# Patient Record
Sex: Male | Born: 1965
Health system: Southern US, Community
[De-identification: ages and names within clinical notes are randomized; demographics above are authoritative.]

## PROBLEM LIST (undated history)

## (undated) DIAGNOSIS — Z789 Other specified health status: Secondary | ICD-10-CM

## (undated) DIAGNOSIS — K219 Gastro-esophageal reflux disease without esophagitis: Secondary | ICD-10-CM

## (undated) HISTORY — PX: COLONOSCOPY: SHX174

---

## 2003-08-08 ENCOUNTER — Encounter: Admission: RE | Admit: 2003-08-08 | Discharge: 2003-08-08 | Payer: Self-pay

## 2004-02-25 ENCOUNTER — Encounter: Admission: RE | Admit: 2004-02-25 | Discharge: 2004-02-25 | Payer: Self-pay | Admitting: General Surgery

## 2008-07-10 ENCOUNTER — Ambulatory Visit (HOSPITAL_COMMUNITY): Admission: RE | Admit: 2008-07-10 | Discharge: 2008-07-10 | Payer: Self-pay | Admitting: Family Medicine

## 2009-09-17 ENCOUNTER — Ambulatory Visit: Payer: Self-pay | Admitting: Family Medicine

## 2011-07-01 ENCOUNTER — Emergency Department (HOSPITAL_COMMUNITY)
Admission: EM | Admit: 2011-07-01 | Discharge: 2011-07-01 | Disposition: A | Discharge status: Home / Self Care | Payer: BC Managed Care – PPO | Attending: Emergency Medicine | Admitting: Emergency Medicine

## 2011-07-01 DIAGNOSIS — S41109A Unspecified open wound of unspecified upper arm, initial encounter: Secondary | ICD-10-CM | POA: Insufficient documentation

## 2011-07-01 DIAGNOSIS — Y990 Civilian activity done for income or pay: Secondary | ICD-10-CM | POA: Insufficient documentation

## 2011-07-01 DIAGNOSIS — W268XXA Contact with other sharp object(s), not elsewhere classified, initial encounter: Secondary | ICD-10-CM | POA: Insufficient documentation

## 2011-07-10 ENCOUNTER — Emergency Department (HOSPITAL_COMMUNITY)
Admission: EM | Admit: 2011-07-10 | Discharge: 2011-07-10 | Disposition: A | Discharge status: Home / Self Care | Payer: BC Managed Care – PPO | Attending: Emergency Medicine | Admitting: Emergency Medicine

## 2011-07-10 DIAGNOSIS — Z4802 Encounter for removal of sutures: Secondary | ICD-10-CM | POA: Insufficient documentation

## 2015-04-07 ENCOUNTER — Emergency Department: Admit: 2015-04-07 | Disposition: A | Discharge status: Home / Self Care | Payer: Self-pay | Admitting: Internal Medicine

## 2015-04-07 LAB — CBC WITH DIFFERENTIAL/PLATELET
Basophil #: 0 10*3/uL (ref 0.0–0.1)
Basophil %: 0.4 %
EOS PCT: 0.2 %
Eosinophil #: 0 10*3/uL (ref 0.0–0.7)
HCT: 46.2 % (ref 40.0–52.0)
HGB: 14.9 g/dL (ref 13.0–18.0)
LYMPHS ABS: 1.6 10*3/uL (ref 1.0–3.6)
Lymphocyte %: 27.9 %
MCH: 28.4 pg (ref 26.0–34.0)
MCHC: 32.3 g/dL (ref 32.0–36.0)
MCV: 88 fL (ref 80–100)
MONO ABS: 1 x10 3/mm (ref 0.2–1.0)
MONOS PCT: 16.5 %
NEUTROS ABS: 3.2 10*3/uL (ref 1.4–6.5)
Neutrophil %: 55 %
PLATELETS: 218 10*3/uL (ref 150–440)
RBC: 5.26 10*6/uL (ref 4.40–5.90)
RDW: 13.2 % (ref 11.5–14.5)
WBC: 5.9 10*3/uL (ref 3.8–10.6)

## 2015-04-07 LAB — COMPREHENSIVE METABOLIC PANEL
ALT: 18 U/L
AST: 22 U/L
Albumin: 4.1 g/dL
Alkaline Phosphatase: 56 U/L
Anion Gap: 8 (ref 7–16)
BUN: 14 mg/dL
Bilirubin,Total: 0.3 mg/dL
CHLORIDE: 98 mmol/L — AB
CO2: 31 mmol/L
Calcium, Total: 9.1 mg/dL
Creatinine: 1.32 mg/dL — ABNORMAL HIGH
EGFR (African American): >60
EGFR (Non-African Amer.): >60
GLUCOSE: 118 mg/dL — AB
Potassium: 4.7 mmol/L
SODIUM: 137 mmol/L
Total Protein: 7.6 g/dL

## 2016-09-05 ENCOUNTER — Emergency Department (HOSPITAL_COMMUNITY)
Admission: EM | Admit: 2016-09-05 | Discharge: 2016-09-05 | Disposition: A | Discharge status: Home / Self Care | Payer: Worker's Compensation | Attending: Emergency Medicine | Admitting: Emergency Medicine

## 2016-09-05 ENCOUNTER — Emergency Department (HOSPITAL_COMMUNITY): Payer: Worker's Compensation

## 2016-09-05 ENCOUNTER — Encounter (HOSPITAL_COMMUNITY): Payer: Self-pay

## 2016-09-05 DIAGNOSIS — W228XXA Striking against or struck by other objects, initial encounter: Secondary | ICD-10-CM | POA: Insufficient documentation

## 2016-09-05 DIAGNOSIS — Z23 Encounter for immunization: Secondary | ICD-10-CM | POA: Insufficient documentation

## 2016-09-05 DIAGNOSIS — S060X0A Concussion without loss of consciousness, initial encounter: Secondary | ICD-10-CM

## 2016-09-05 DIAGNOSIS — S0191XA Laceration without foreign body of unspecified part of head, initial encounter: Secondary | ICD-10-CM

## 2016-09-05 DIAGNOSIS — Y939 Activity, unspecified: Secondary | ICD-10-CM | POA: Insufficient documentation

## 2016-09-05 DIAGNOSIS — Y929 Unspecified place or not applicable: Secondary | ICD-10-CM | POA: Insufficient documentation

## 2016-09-05 DIAGNOSIS — Y990 Civilian activity done for income or pay: Secondary | ICD-10-CM | POA: Diagnosis not present

## 2016-09-05 MED ORDER — TETANUS-DIPHTH-ACELL PERTUSSIS 5-2.5-18.5 LF-MCG/0.5 IM SUSP
0.5000 mL | Freq: Once | INTRAMUSCULAR | Status: AC
  Administered 2016-09-05: 0.5 mL via INTRAMUSCULAR
  Filled 2016-09-05: qty 0.5

## 2016-09-05 MED ORDER — ACETAMINOPHEN 325 MG PO TABS
650.0000 mg | ORAL_TABLET | Freq: Once | ORAL | Status: AC
  Administered 2016-09-05: 650 mg via ORAL
  Filled 2016-09-05: qty 2

## 2016-09-05 NOTE — ED Notes (Signed)
Pt departed in NAD, refused use of wheelchair.  

## 2016-09-05 NOTE — ED Triage Notes (Signed)
Pt reports he hit his head on a piece of steel at work. Pt has small 1 inch laceration to the top of his head. He reports he had near syncope at the time of the incidence. No reports headache and nausea.

## 2016-09-05 NOTE — ED Provider Notes (Signed)
[facility] Provider Note   CSN: [id number] Arrival date & time: 09/05/16  1221     History   Chief Complaint Chief Complaint  Patient presents with  . Head Injury    HPI Marcy PanningDanny Grabill is a 50 y.o. male with no significant past medical history who presents to the ED today to be evaluated after a head injury. 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 He states he stood up too quickly and struck the top of his head on a steel bar causing a laceration to the top of his head. Patient states that he nearly "blacked out" and fell to the ground afterwards. Patient has had intermittent blurry vision, dizziness and nausea since then. Patient reports increased dizziness upon standing and feeling off balance. She does not anticoagulated. Unknown last tetanus shot. He denies any paresthesias or neck pain.  HPI  History reviewed. No pertinent past medical history.  There are no active problems to display for this patient.   History reviewed. No pertinent surgical history.     Home Medications    Prior to Admission medications   Not on File    Family History History reviewed. No pertinent family history.  Social History Social History  Substance Use Topics  . Smoking status: Never Smoker  . Smokeless tobacco: Never Used  . Alcohol use Yes     Comment: occasional      Allergies   Diphenhydramine   Review of Systems Review of Systems  All other systems reviewed and are negative.    Physical Exam Updated Vital Signs BP 134/95   Pulse 62   Temp 97.8 F (36.6 C) (Oral)   Resp 20   SpO2 98%   Physical Exam  Constitutional: He is oriented to person, place, and time. He appears well-developed and well-nourished. No distress.  HENT:  Head: Normocephalic.  Mouth/Throat: No oropharyngeal exudate.  No battle sign. No raccoon eyes. No hemotympanum.  1 cm laceration to top of scalp. No foreign body seen or palpated. No  sign of skull depression.  Eyes: Conjunctivae and EOM are normal. Pupils are equal, round, and reactive to light. Right eye exhibits no discharge. Left eye exhibits no discharge. No scleral icterus.  Neck: Normal range of motion. Neck supple.  Cardiovascular: Normal rate, regular rhythm, normal heart sounds and intact distal pulses.  Exam reveals no gallop and no friction rub.   No murmur heard. Pulmonary/Chest: Effort normal and breath sounds normal. No respiratory distress. He has no wheezes. He has no rales. He exhibits no tenderness.  Abdominal: Soft. He exhibits no distension. There is no tenderness. There is no guarding.  Musculoskeletal: Normal range of motion. He exhibits no edema.  Neurological: He is alert and oriented to person, place, and time.  Strength 5/5 throughout. No sensory deficits. No gait abnormality. No dysmetria. No slurred speech. No facial droop. Negative pronator drift.    Skin: Skin is warm and dry. No rash noted. He is not diaphoretic. No erythema. No pallor.  Psychiatric: He has a normal mood and affect. His behavior is normal.  Nursing note and vitals reviewed.    ED Treatments / Results  Labs (all labs ordered are listed, but only abnormal results are displayed) Labs Reviewed  URINE RAPID DRUG SCREEN, HOSP PERFORMED    EKG  EKG Interpretation None       Radiology No results found.  Procedures Procedures (including critical care time)  LACERATION  REPAIR Performed by: Dub Mikes Authorized by: Dub Mikes Consent: Verbal consent obtained. Risks and benefits: risks, benefits and alternatives were discussed Consent given by: patient Patient identity confirmed: provided demographic data Prepped and Draped in normal sterile fashion Wound explored  Laceration Location: scalp  Laceration Length: 1 cm  No Foreign Bodies seen or palpated  Anesthesia: local infiltration  Local anesthetic: None  Irrigation method:  syringe Amount of cleaning: standard  Skin closure: Approximated   Technique: One staple   Patient tolerance: Patient tolerated the procedure well with no immediate complications.   Medications Ordered in ED Medications  Tdap (BOOSTRIX) injection 0.5 mL (not administered)     Initial Impression / Assessment and Plan / ED Course  I have reviewed the triage vital signs and the nursing notes.  Pertinent labs & imaging results that were available during my care of the patient were reviewed by me and considered in my medical decision making (see chart for details).  Clinical Course    Otherwise healthy 50 year old male presents to the ED today to be evaluated after head injury. Patient struck his head on a steel bar around 10:30 this morning and subsequently felt near-syncope, dizziness, nausea. No neurological deficits noted on exam. Laceration repair to the ED with one staple. Patient tolerated the procedure well. Patient signed out to Fayrene Helper PA-C at shift change pending CT head. If negative patient will be discharged home with PCP follow-up. Patient also notes that he needs to have the staple removed in 1 week. Return precautions discussed with patient and family members.  Final Clinical Impressions(s) / ED Diagnoses   Final diagnoses:  Concussion, without loss of consciousness, initial encounter  Laceration of head, initial encounter    New Prescriptions New Prescriptions   No medications on file     Dub Mikes, PA-C 09/05/16 2013    Raeford Razor, MD 09/05/16 [phone]

## 2016-09-05 NOTE — ED Notes (Signed)
Pt has dried blood on top of head

## 2016-09-05 NOTE — Discharge Instructions (Signed)
Follow up at either an urgent care or this department in 1 week for staple removal. Return to the ED sooner if you experience loss of consciousness, blurred vision, weakness, vomiting.

## 2016-09-11 ENCOUNTER — Emergency Department (HOSPITAL_COMMUNITY)
Admission: EM | Admit: 2016-09-11 | Discharge: 2016-09-11 | Disposition: A | Discharge status: Home / Self Care | Payer: Worker's Compensation | Attending: Emergency Medicine | Admitting: Emergency Medicine

## 2016-09-11 ENCOUNTER — Encounter (HOSPITAL_COMMUNITY): Payer: Self-pay | Admitting: Emergency Medicine

## 2016-09-11 DIAGNOSIS — Z4802 Encounter for removal of sutures: Secondary | ICD-10-CM | POA: Diagnosis not present

## 2016-09-11 NOTE — ED Triage Notes (Signed)
Here for staple removal-- has 1 staple in scalp.

## 2016-09-11 NOTE — Discharge Instructions (Signed)
Please read and follow all provided instructions.  Your diagnoses today include:  1. Removal of staple    Tests performed today include: Vital signs. See below for your results today.   Medications prescribed:  Take as prescribed   Home care instructions:  Follow any educational materials contained in this packet.  Follow-up instructions: Please follow-up with your primary care provider for further evaluation of symptoms and treatment   Return instructions:  Please return to the Emergency Department if you do not get better, if you get worse, or new symptoms OR  - Fever (temperature greater than 101.27F)  - Bleeding that does not stop with holding pressure to the area    -Severe pain (please note that you may be more sore the day after your accident)  - Chest Pain  - Difficulty breathing  - Severe nausea or vomiting  - Inability to tolerate food and liquids  - Passing out  - Skin becoming red around your wounds  - Change in mental status (confusion or lethargy)  - New numbness or weakness    Please return if you have any other emergent concerns.  Additional Information:  Your vital signs today were: BP 146/100    Pulse 78    Temp 98.1 F (36.7 C) (Oral)    Resp 16    SpO2 100%  If your blood pressure (BP) was elevated above 135/85 this visit, please have this repeated by your doctor within one month. ---------------

## 2016-09-11 NOTE — ED Provider Notes (Signed)
[facility] Provider Note   CSN: [id number] Arrival date & time: 09/11/16  1011  By signing my name below, I, Phillis HaggisGabriella Gaje, attest that this documentation has been prepared under the direction and in the presence of Audry Piliyler Bobi Daudelin, PA-C. Electronically Signed: Phillis HaggisGabriella Gaje, ED Scribe. 09/11/16. 10:15 AM.   History   Chief Complaint Chief Complaint  Patient presents with  . Suture / Staple Removal   The history is provided by the patient. No language interpreter was used.   HPI Comments: Bruce Diaz is a 50 y.o. male who presents to the Emergency Department requesting a staple removal. Pt was seen on 09/05/16 s/p head injury where he struck his head against a steel bar. One staple was placed with no complications. Pt denies fever, chills, nausea, vomiting, or color change. No drainage from site. No other symptoms noted.    No past medical history on file.  There are no active problems to display for this patient.   No past surgical history on file.   Home Medications    Prior to Admission medications   Not on File    Family History No family history on file.  Social History Social History  Substance Use Topics  . Smoking status: Never Smoker  . Smokeless tobacco: Never Used  . Alcohol use Yes     Comment: occasional      Allergies   Diphenhydramine   Review of Systems Review of Systems  Constitutional: Negative for chills and fever.  Gastrointestinal: Negative for nausea and vomiting.  Skin: Positive for wound. Negative for color change.   Physical Exam Updated Vital Signs BP 146/100   Pulse 78   Temp 98.1 F (36.7 C) (Oral)   Resp 16   SpO2 100%   Physical Exam  Constitutional: He is oriented to person, place, and time. He appears well-developed and well-nourished.  HENT:  Head: Normocephalic and atraumatic.  Eyes: EOM are normal.  Neck: Normal range of motion. Neck supple.  Cardiovascular: Normal rate.   Pulmonary/Chest: Effort normal.    Musculoskeletal: Normal range of motion.  1 cm laceration to the top of scalp, well healed, no signs of infection, or erythema.  Neurological: He is alert and oriented to person, place, and time.  Skin: Skin is warm and dry.  Psychiatric: He has a normal mood and affect. His behavior is normal.  Nursing note and vitals reviewed.  ED Treatments / Results  DIAGNOSTIC STUDIES: Oxygen Saturation is 100% on RA, normal by my interpretation.    COORDINATION OF CARE: 10:18 AM-Discussed treatment plan which includes staple removal with pt at bedside and pt agreed to plan.    Labs (all labs ordered are listed, but only abnormal results are displayed) Labs Reviewed - No data to display  EKG  EKG Interpretation None       Radiology No results found.  Procedures .Suture Removal Date/Time: 09/11/2016 10:19 AM Performed by: Audry PiliMOHR, Val Farnam Authorized by: Audry PiliMOHR, Dangelo Guzzetta   Consent:    Consent obtained:  Verbal   Consent given by:  Patient Location:    Location:  Head/neck   Head/neck location:  Scalp Procedure details:    Wound appearance:  No signs of infection   Number of staples removed:  1 Post-procedure details:    Post-removal:  No dressing applied   Patient tolerance of procedure:  Tolerated well, no immediate complications   (including critical care time)  Medications Ordered in ED Medications - No data to display   Initial Impression /  Assessment and Plan / ED Course  I have reviewed the triage vital signs and the nursing notes.  Pertinent labs & imaging results that were available during my care of the patient were reviewed by me and considered in my medical decision making (see chart for details).  Clinical Course   Final Clinical Impressions(s) / ED Diagnoses   I have reviewed the relevant previous healthcare records. I obtained HPI from historian.  ED Course:  Assessment:  Staple removal   Pt to ER for staple/suture removal and wound check as above.  Procedure tolerated well. Vitals normal, no signs of infection. Scar minimization & return precautions given at dc.   Disposition/Plan:  DC Home Additional Verbal discharge instructions given and discussed with patient.  Pt Instructed to f/u with PCP in the next week for evaluation and treatment of symptoms. Return precautions given Pt acknowledges and agrees with plan  Supervising Physician Jacalyn Lefevre, MD   Final diagnoses:  Removal of staple   I personally performed the services described in this documentation, which was scribed in my presence. The recorded information has been reviewed and is accurate.   New Prescriptions New Prescriptions   No medications on file     Audry Pili, PA-C 09/11/16 1021    Jacalyn Lefevre, MD 09/11/16 1242

## 2016-09-11 NOTE — ED Notes (Signed)
Declined W/C at D/C and was escorted to lobby by RN. 

## 2017-05-26 ENCOUNTER — Emergency Department (HOSPITAL_BASED_OUTPATIENT_CLINIC_OR_DEPARTMENT_OTHER): Payer: Worker's Compensation

## 2017-05-26 ENCOUNTER — Encounter (HOSPITAL_BASED_OUTPATIENT_CLINIC_OR_DEPARTMENT_OTHER): Payer: Self-pay | Admitting: Emergency Medicine

## 2017-05-26 ENCOUNTER — Emergency Department (HOSPITAL_BASED_OUTPATIENT_CLINIC_OR_DEPARTMENT_OTHER)
Admission: EM | Admit: 2017-05-26 | Discharge: 2017-05-26 | Disposition: A | Discharge status: Home / Self Care | Payer: Worker's Compensation | Attending: Emergency Medicine | Admitting: Emergency Medicine

## 2017-05-26 DIAGNOSIS — S93402A Sprain of unspecified ligament of left ankle, initial encounter: Secondary | ICD-10-CM | POA: Diagnosis not present

## 2017-05-26 DIAGNOSIS — Y990 Civilian activity done for income or pay: Secondary | ICD-10-CM | POA: Insufficient documentation

## 2017-05-26 DIAGNOSIS — Y939 Activity, unspecified: Secondary | ICD-10-CM | POA: Insufficient documentation

## 2017-05-26 DIAGNOSIS — Y9259 Other trade areas as the place of occurrence of the external cause: Secondary | ICD-10-CM | POA: Insufficient documentation

## 2017-05-26 DIAGNOSIS — S99922A Unspecified injury of left foot, initial encounter: Secondary | ICD-10-CM | POA: Diagnosis present

## 2017-05-26 DIAGNOSIS — T1490XA Injury, unspecified, initial encounter: Secondary | ICD-10-CM

## 2017-05-26 DIAGNOSIS — W230XXA Caught, crushed, jammed, or pinched between moving objects, initial encounter: Secondary | ICD-10-CM | POA: Insufficient documentation

## 2017-05-26 MED ORDER — IBUPROFEN 600 MG PO TABS: 600.0000 mg | ORAL_TABLET | Freq: Four times a day (QID) | ORAL | 0 refills | Status: DC | PRN

## 2017-05-26 NOTE — Discharge Instructions (Signed)
I recommend wearing your ankle brace throughout the day while you are on her feet. Continue to rest, elevate and apply ice to your ankle for 15-20 minutes 3-4 times daily. Follow-up with the podiatry clinic listed below her symptoms have not improved over the next week. Please return to the Emergency Department if symptoms worsen or new onset of fever, redness, swelling, warmth, numbness, weakness, decreased range of motion.

## 2017-05-26 NOTE — ED Triage Notes (Signed)
Patient states that a piece of steel fell onto his foot and ankle 2 weeks ago. The patient states that it is not healing and he is concerned

## 2017-05-26 NOTE — ED Provider Notes (Signed)
[facility]   CSN: [id number] Arrival date & time: 05/26/17  1631   By signing my name below, I, Soijett Blue, attest that this documentation has been prepared under the direction and in the presence of Melburn HakeNicole Nadeau, PA-C Electronically Signed: Soijett Blue, ED Scribe. 05/26/17. 6:21 PM.  History   Chief Complaint Chief Complaint  Patient presents with  . Wound Check    HPI Bruce Diaz is a 51 y.o. male who presents to the Emergency Department complaining of wound check to left foot onset 2 weeks ago. Pt reports associated intermittent left foot swelling with prolonged standing and intermittent tingling to left toes. Pt has not tried any medications for the relief of his symptoms. He states that he was at work when a 150 lb steel door slipped and fell onto his left foot. He reports that his symptoms mildly improved following the incident but pain has continued. He denies swelling, color change, and any other symptoms.Denies any prior injury or surgery to his left lower extremity.   The history is provided by the patient. No language interpreter was used.    History reviewed. No pertinent past medical history.  There are no active problems to display for this patient.   History reviewed. No pertinent surgical history.     Home Medications    Prior to Admission medications   Medication Sig Start Date End Date Taking? Authorizing Provider  ibuprofen (ADVIL,MOTRIN) 600 MG tablet Take 1 tablet (600 mg total) by mouth every 6 (six) hours as needed. 05/26/17   Barrett HenleNadeau, Nicole Elizabeth, PA-C    Family History History reviewed. No pertinent family history.  Social History Social History  Substance Use Topics  . Smoking status: Never Smoker  . Smokeless tobacco: Never Used  . Alcohol use Yes     Comment: occasional      Allergies   Diphenhydramine   Review of Systems Review of Systems  Musculoskeletal: Positive for arthralgias (left foot and  left ankle). Negative for joint swelling.  Skin: Positive for wound (abrasions to left foot). Negative for color change.  All other systems reviewed and are negative.    Physical Exam Updated Vital Signs BP 113/81 (BP Location: Left Arm)   Pulse 87   Temp 98.3 F (36.8 C) (Oral)   Resp 18   Ht 6' (1.829 m)   Wt 215 lb (97.5 kg)   SpO2 100%   BMI 29.16 kg/m   Physical Exam  Constitutional: He is oriented to person, place, and time. He appears well-developed and well-nourished.  HENT:  Head: Normocephalic and atraumatic.  Eyes: Conjunctivae and EOM are normal. Right eye exhibits no discharge. Left eye exhibits no discharge. No scleral icterus.  Neck: Normal range of motion. Neck supple.  Cardiovascular: Normal rate and intact distal pulses.   Pulmonary/Chest: Effort normal.  Musculoskeletal: Normal range of motion. He exhibits tenderness. He exhibits no edema or deformity.       Left knee: Normal.       Left ankle: He exhibits normal range of motion, no swelling, no ecchymosis, no deformity, no laceration and normal pulse. Tenderness. AITFL tenderness found. Achilles tendon normal.       Left lower leg: Normal.       Left foot: There is tenderness. There is normal range of motion, no swelling, normal capillary refill, no crepitus, no deformity and no laceration.       Feet:  Mild tenderness over top of left proximal forefoot without swelling,  erythema, or warmth. FROM of left foot, ankle, and knee with 5/5 strength. Sensation intact 2+ dp pulse. Cap refill less than 2.  Neurological: He is alert and oriented to person, place, and time.  Skin: Skin is warm and dry. Capillary refill takes less than 2 seconds.  Nursing note and vitals reviewed.    ED Treatments / Results  DIAGNOSTIC STUDIES: Oxygen Saturation is 100% on RA, nl by my interpretation.    COORDINATION OF CARE: 6:10 PM Discussed treatment plan with pt at bedside which includes left ankle xray, left foot xray, and  pt agreed to plan.   Labs (all labs ordered are listed, but only abnormal results are displayed) Labs Reviewed - No data to display  EKG  EKG Interpretation None       Radiology Dg Ankle Complete Left  Result Date: 05/26/2017 CLINICAL DATA:  Persistent pain after heavy object fell onto foot and ankle EXAM: LEFT ANKLE COMPLETE - 3+ VIEW COMPARISON:  None. FINDINGS: Frontal, oblique, and lateral views obtained. There is mild soft tissue swelling. There is no fracture or joint effusion. The ankle mortise appears intact. There is calcification in the distal Achilles tendon. There is no appreciable joint space narrowing. IMPRESSION: No evident fracture or arthropathy. Mild soft tissue swelling. Distal Achilles tendinosis. Ankle mortise appears intact. Electronically Signed   By: Bretta Bang III M.D.   On: 05/26/2017 17:27   Dg Foot Complete Left  Result Date: 05/26/2017 CLINICAL DATA:  Persistent pain after heavy object fell on foot EXAM: LEFT FOOT - COMPLETE 3+ VIEW COMPARISON:  None. FINDINGS: Frontal, oblique, and lateral views were obtained. There is no evident fracture or dislocation. Joint spaces appear normal. No erosive change. IMPRESSION: No fracture or dislocation.  No evident arthropathy. Electronically Signed   By: Bretta Bang III M.D.   On: 05/26/2017 17:24    Procedures Procedures (including critical care time)  Medications Ordered in ED Medications - No data to display   Initial Impression / Assessment and Plan / ED Course  I have reviewed the triage vital signs and the nursing notes.  Pertinent labs & imaging results that were available during my care of the patient were reviewed by me and considered in my medical decision making (see chart for details).     Patient X-Ray negative for obvious fracture or dislocation.  Suspect ankle sprain. Pt advised to follow up with orthopedics. Patient given ASO brace while in ED. Will discharge home with ibuprofen  prescription. Advised pt of conservative therapy recommended and discussed. Patient will be discharged home & is agreeable with above plan. Returns precautions discussed. Pt appears safe for discharge.  Final Clinical Impressions(s) / ED Diagnoses   Final diagnoses:  Sprain of left ankle, unspecified ligament, initial encounter    New Prescriptions New Prescriptions   IBUPROFEN (ADVIL,MOTRIN) 600 MG TABLET    Take 1 tablet (600 mg total) by mouth every 6 (six) hours as needed.   I personally performed the services described in this documentation, which was scribed in my presence. The recorded information has been reviewed and is accurate.     Barrett Henle, PA-C 05/26/17 Claria Dice    Geoffery Lyons, MD 05/26/17 2123

## 2018-04-11 DIAGNOSIS — M25512 Pain in left shoulder: Secondary | ICD-10-CM | POA: Diagnosis not present

## 2018-04-11 DIAGNOSIS — M25511 Pain in right shoulder: Secondary | ICD-10-CM | POA: Diagnosis not present

## 2018-04-12 ENCOUNTER — Other Ambulatory Visit: Payer: Self-pay | Admitting: Orthopedic Surgery

## 2018-04-12 DIAGNOSIS — M25512 Pain in left shoulder: Secondary | ICD-10-CM

## 2018-04-14 ENCOUNTER — Ambulatory Visit: Payer: BLUE CROSS/BLUE SHIELD

## 2018-06-18 DIAGNOSIS — H5213 Myopia, bilateral: Secondary | ICD-10-CM | POA: Diagnosis not present

## 2018-08-01 DIAGNOSIS — Z6830 Body mass index (BMI) 30.0-30.9, adult: Secondary | ICD-10-CM | POA: Diagnosis not present

## 2018-08-01 DIAGNOSIS — J069 Acute upper respiratory infection, unspecified: Secondary | ICD-10-CM | POA: Diagnosis not present

## 2018-10-18 DIAGNOSIS — R0602 Shortness of breath: Secondary | ICD-10-CM | POA: Diagnosis not present

## 2018-10-18 DIAGNOSIS — Z0000 Encounter for general adult medical examination without abnormal findings: Secondary | ICD-10-CM | POA: Diagnosis not present

## 2018-10-18 DIAGNOSIS — Z1331 Encounter for screening for depression: Secondary | ICD-10-CM | POA: Diagnosis not present

## 2018-10-18 DIAGNOSIS — Z6830 Body mass index (BMI) 30.0-30.9, adult: Secondary | ICD-10-CM | POA: Diagnosis not present

## 2018-10-18 DIAGNOSIS — Z1211 Encounter for screening for malignant neoplasm of colon: Secondary | ICD-10-CM | POA: Diagnosis not present

## 2018-12-17 DIAGNOSIS — Z01818 Encounter for other preprocedural examination: Secondary | ICD-10-CM | POA: Diagnosis not present

## 2018-12-17 DIAGNOSIS — Z1211 Encounter for screening for malignant neoplasm of colon: Secondary | ICD-10-CM | POA: Diagnosis not present

## 2018-12-17 DIAGNOSIS — Z8371 Family history of colonic polyps: Secondary | ICD-10-CM | POA: Diagnosis not present

## 2018-12-28 DIAGNOSIS — Z8371 Family history of colonic polyps: Secondary | ICD-10-CM | POA: Diagnosis not present

## 2018-12-28 DIAGNOSIS — K640 First degree hemorrhoids: Secondary | ICD-10-CM | POA: Diagnosis not present

## 2018-12-28 DIAGNOSIS — Z1211 Encounter for screening for malignant neoplasm of colon: Secondary | ICD-10-CM | POA: Diagnosis not present

## 2019-02-13 DIAGNOSIS — G8929 Other chronic pain: Secondary | ICD-10-CM | POA: Diagnosis not present

## 2019-02-13 DIAGNOSIS — M25511 Pain in right shoulder: Secondary | ICD-10-CM | POA: Diagnosis not present

## 2019-02-13 DIAGNOSIS — M25512 Pain in left shoulder: Secondary | ICD-10-CM | POA: Diagnosis not present

## 2019-02-26 ENCOUNTER — Other Ambulatory Visit: Payer: Self-pay | Admitting: Orthopedic Surgery

## 2019-02-26 DIAGNOSIS — G8929 Other chronic pain: Secondary | ICD-10-CM

## 2019-02-26 DIAGNOSIS — M25511 Pain in right shoulder: Principal | ICD-10-CM

## 2019-02-26 DIAGNOSIS — M25512 Pain in left shoulder: Secondary | ICD-10-CM

## 2019-03-05 ENCOUNTER — Ambulatory Visit: Payer: BLUE CROSS/BLUE SHIELD

## 2019-04-03 ENCOUNTER — Ambulatory Visit: Payer: BLUE CROSS/BLUE SHIELD

## 2019-04-04 ENCOUNTER — Ambulatory Visit
Admission: RE | Admit: 2019-04-04 | Discharge: 2019-04-04 | Disposition: A | Discharge status: Home / Self Care | Payer: BLUE CROSS/BLUE SHIELD | Source: Ambulatory Visit | Attending: Orthopedic Surgery | Admitting: Orthopedic Surgery

## 2019-04-04 ENCOUNTER — Other Ambulatory Visit: Payer: Self-pay

## 2019-04-04 DIAGNOSIS — M25512 Pain in left shoulder: Secondary | ICD-10-CM | POA: Diagnosis not present

## 2019-04-04 DIAGNOSIS — G8929 Other chronic pain: Secondary | ICD-10-CM

## 2019-04-04 DIAGNOSIS — M25511 Pain in right shoulder: Secondary | ICD-10-CM | POA: Insufficient documentation

## 2019-04-04 DIAGNOSIS — M75121 Complete rotator cuff tear or rupture of right shoulder, not specified as traumatic: Secondary | ICD-10-CM | POA: Diagnosis not present

## 2019-04-04 DIAGNOSIS — M75122 Complete rotator cuff tear or rupture of left shoulder, not specified as traumatic: Secondary | ICD-10-CM | POA: Diagnosis not present

## 2019-04-23 DIAGNOSIS — M75122 Complete rotator cuff tear or rupture of left shoulder, not specified as traumatic: Secondary | ICD-10-CM | POA: Diagnosis not present

## 2019-04-23 DIAGNOSIS — M75121 Complete rotator cuff tear or rupture of right shoulder, not specified as traumatic: Secondary | ICD-10-CM | POA: Diagnosis not present

## 2019-05-01 ENCOUNTER — Encounter: Payer: Self-pay | Admitting: *Deleted

## 2019-05-01 ENCOUNTER — Other Ambulatory Visit: Payer: Self-pay

## 2019-05-03 ENCOUNTER — Other Ambulatory Visit
Admission: RE | Admit: 2019-05-03 | Discharge: 2019-05-03 | Disposition: A | Discharge status: Home / Self Care | Payer: BLUE CROSS/BLUE SHIELD | Source: Ambulatory Visit | Attending: Orthopedic Surgery | Admitting: Orthopedic Surgery

## 2019-05-03 ENCOUNTER — Other Ambulatory Visit: Payer: Self-pay

## 2019-05-03 DIAGNOSIS — Z1159 Encounter for screening for other viral diseases: Secondary | ICD-10-CM | POA: Diagnosis not present

## 2019-05-03 DIAGNOSIS — Z01812 Encounter for preprocedural laboratory examination: Secondary | ICD-10-CM | POA: Diagnosis not present

## 2019-05-04 LAB — NOVEL CORONAVIRUS, NAA (HOSP ORDER, SEND-OUT TO REF LAB; TAT 18-24 HRS): SARS-CoV-2, NAA: NOT DETECTED

## 2019-05-09 ENCOUNTER — Ambulatory Visit: Payer: BLUE CROSS/BLUE SHIELD | Admitting: Anesthesiology

## 2019-05-09 ENCOUNTER — Encounter
Admission: RE | Disposition: A | Discharge status: Home / Self Care | Payer: Self-pay | Source: Home / Self Care | Attending: Orthopedic Surgery

## 2019-05-09 ENCOUNTER — Ambulatory Visit
Admission: RE | Admit: 2019-05-09 | Discharge: 2019-05-09 | Disposition: A | Discharge status: Home / Self Care | Payer: BLUE CROSS/BLUE SHIELD | Attending: Orthopedic Surgery | Admitting: Orthopedic Surgery

## 2019-05-09 DIAGNOSIS — M7591 Shoulder lesion, unspecified, right shoulder: Secondary | ICD-10-CM | POA: Insufficient documentation

## 2019-05-09 DIAGNOSIS — M75101 Unspecified rotator cuff tear or rupture of right shoulder, not specified as traumatic: Secondary | ICD-10-CM | POA: Diagnosis present

## 2019-05-09 DIAGNOSIS — M7501 Adhesive capsulitis of right shoulder: Secondary | ICD-10-CM | POA: Diagnosis not present

## 2019-05-09 DIAGNOSIS — Z888 Allergy status to other drugs, medicaments and biological substances status: Secondary | ICD-10-CM | POA: Insufficient documentation

## 2019-05-09 DIAGNOSIS — M25811 Other specified joint disorders, right shoulder: Secondary | ICD-10-CM | POA: Diagnosis not present

## 2019-05-09 DIAGNOSIS — M75121 Complete rotator cuff tear or rupture of right shoulder, not specified as traumatic: Secondary | ICD-10-CM | POA: Diagnosis not present

## 2019-05-09 DIAGNOSIS — M65811 Other synovitis and tenosynovitis, right shoulder: Secondary | ICD-10-CM | POA: Diagnosis not present

## 2019-05-09 DIAGNOSIS — M19011 Primary osteoarthritis, right shoulder: Secondary | ICD-10-CM | POA: Insufficient documentation

## 2019-05-09 DIAGNOSIS — Z791 Long term (current) use of non-steroidal anti-inflammatories (NSAID): Secondary | ICD-10-CM | POA: Diagnosis not present

## 2019-05-09 DIAGNOSIS — M25511 Pain in right shoulder: Secondary | ICD-10-CM | POA: Diagnosis not present

## 2019-05-09 DIAGNOSIS — M7521 Bicipital tendinitis, right shoulder: Secondary | ICD-10-CM | POA: Diagnosis not present

## 2019-05-09 DIAGNOSIS — G8918 Other acute postprocedural pain: Secondary | ICD-10-CM | POA: Diagnosis not present

## 2019-05-09 DIAGNOSIS — M75111 Incomplete rotator cuff tear or rupture of right shoulder, not specified as traumatic: Secondary | ICD-10-CM | POA: Diagnosis not present

## 2019-05-09 DIAGNOSIS — M7541 Impingement syndrome of right shoulder: Secondary | ICD-10-CM | POA: Diagnosis not present

## 2019-05-09 HISTORY — PX: SHOULDER ARTHROSCOPY WITH OPEN ROTATOR CUFF REPAIR: SHX6092

## 2019-05-09 HISTORY — DX: Other specified health status: Z78.9

## 2019-05-09 SURGERY — ARTHROSCOPY, SHOULDER WITH REPAIR, ROTATOR CUFF, OPEN
Anesthesia: General | Site: Shoulder | Laterality: Right

## 2019-05-09 MED ORDER — OXYCODONE HCL 5 MG/5ML PO SOLN: 5.0000 mg | Freq: Once | ORAL | Status: DC | PRN

## 2019-05-09 MED ORDER — ACETAMINOPHEN 160 MG/5ML PO SOLN: 325.0000 mg | ORAL | Status: DC | PRN

## 2019-05-09 MED ORDER — LIDOCAINE HCL (CARDIAC) PF 100 MG/5ML IV SOSY
PREFILLED_SYRINGE | INTRAVENOUS | Status: DC | PRN
  Administered 2019-05-09: 40 mg via INTRATRACHEAL

## 2019-05-09 MED ORDER — ONDANSETRON 4 MG PO TBDP: 4.0000 mg | ORAL_TABLET | Freq: Three times a day (TID) | ORAL | 0 refills | Status: DC | PRN

## 2019-05-09 MED ORDER — PROPOFOL 10 MG/ML IV BOLUS
INTRAVENOUS | Status: DC | PRN
  Administered 2019-05-09: 170 mg via INTRAVENOUS

## 2019-05-09 MED ORDER — FENTANYL CITRATE (PF) 100 MCG/2ML IJ SOLN
INTRAMUSCULAR | Status: DC | PRN
  Administered 2019-05-09: 100 ug via INTRAVENOUS

## 2019-05-09 MED ORDER — MIDAZOLAM HCL 5 MG/5ML IJ SOLN
INTRAMUSCULAR | Status: DC | PRN
  Administered 2019-05-09: 2 mg via INTRAVENOUS

## 2019-05-09 MED ORDER — LACTATED RINGERS IV SOLN
INTRAVENOUS | Status: DC
  Administered 2019-05-09 (×2): via INTRAVENOUS

## 2019-05-09 MED ORDER — GLYCOPYRROLATE 0.2 MG/ML IJ SOLN
INTRAMUSCULAR | Status: DC | PRN
  Administered 2019-05-09: 0.1 mg via INTRAVENOUS

## 2019-05-09 MED ORDER — BUPIVACAINE HCL (PF) 0.5 % IJ SOLN
INTRAMUSCULAR | Status: DC | PRN
  Administered 2019-05-09: 20 mL

## 2019-05-09 MED ORDER — ASPIRIN EC 325 MG PO TBEC: 325.0000 mg | DELAYED_RELEASE_TABLET | Freq: Every day | ORAL | 0 refills | Status: AC

## 2019-05-09 MED ORDER — OXYCODONE HCL 5 MG PO TABS: 5.0000 mg | ORAL_TABLET | ORAL | 0 refills | Status: DC | PRN

## 2019-05-09 MED ORDER — ONDANSETRON HCL 4 MG/2ML IJ SOLN
4.0000 mg | Freq: Once | INTRAMUSCULAR | Status: AC | PRN
  Administered 2019-05-09: 4 mg via INTRAVENOUS

## 2019-05-09 MED ORDER — CEFAZOLIN SODIUM-DEXTROSE 2-4 GM/100ML-% IV SOLN
2.0000 g | Freq: Once | INTRAVENOUS | Status: AC
  Administered 2019-05-09: 2 g via INTRAVENOUS

## 2019-05-09 MED ORDER — ACETAMINOPHEN 500 MG PO TABS: 1000.0000 mg | ORAL_TABLET | Freq: Three times a day (TID) | ORAL | 2 refills | Status: DC

## 2019-05-09 MED ORDER — FENTANYL CITRATE (PF) 100 MCG/2ML IJ SOLN: 25.0000 ug | INTRAMUSCULAR | Status: DC | PRN

## 2019-05-09 MED ORDER — BUPIVACAINE LIPOSOME 1.3 % IJ SUSP
INTRAMUSCULAR | Status: DC | PRN
  Administered 2019-05-09: 20 mL

## 2019-05-09 MED ORDER — DEXAMETHASONE SODIUM PHOSPHATE 4 MG/ML IJ SOLN
INTRAMUSCULAR | Status: DC | PRN
  Administered 2019-05-09: 4 mg via INTRAVENOUS

## 2019-05-09 MED ORDER — LACTATED RINGERS IV SOLN
INTRAVENOUS | Status: DC | PRN
  Administered 2019-05-09: 10 mL

## 2019-05-09 MED ORDER — OXYCODONE HCL 5 MG PO TABS: 5.0000 mg | ORAL_TABLET | Freq: Once | ORAL | Status: DC | PRN

## 2019-05-09 MED ORDER — ACETAMINOPHEN 325 MG PO TABS: 325.0000 mg | ORAL_TABLET | ORAL | Status: DC | PRN

## 2019-05-09 SURGICAL SUPPLY — 68 items
ADAPTER IRRIG TUBE 2 SPIKE SOL (ADAPTER) ×4 IMPLANT
ANCHOR ICONIX SPEED 2.0 TAPE (Anchor) ×4 IMPLANT
ANCHOR SUT BIO SW 4.75X19.1 (Anchor) ×4 IMPLANT
ANCHOR SUT BIOCOMP LK 2.9X12.5 (Anchor) IMPLANT
ANCHOR SUT FBRTK SUTURETAP 1.3 (Anchor) ×2 IMPLANT
BIT DRILL RIGD1.8MM FBRTK STRL (DRILL) ×1 IMPLANT
BUR BR 5.5 12 FLUTE (BURR) IMPLANT
BUR RADIUS 4.0X18.5 (BURR) ×2 IMPLANT
CANNULA 5.75X7 CRYSTAL CLEAR (CANNULA) ×2 IMPLANT
CANNULA PARTIAL THREAD 2X7 (CANNULA) ×2 IMPLANT
CHLORAPREP W/TINT 26 (MISCELLANEOUS) ×4 IMPLANT
COOLER POLAR GLACIER W/PUMP (MISCELLANEOUS) ×2 IMPLANT
COVER LIGHT HANDLE UNIVERSAL (MISCELLANEOUS) ×4 IMPLANT
DERMABOND ADVANCED (GAUZE/BANDAGES/DRESSINGS) ×1
DERMABOND ADVANCED .7 DNX12 (GAUZE/BANDAGES/DRESSINGS) ×1 IMPLANT
DRAPE IMP U-DRAPE 54X76 (DRAPES) ×4 IMPLANT
DRAPE INCISE IOBAN 66X45 STRL (DRAPES) ×2 IMPLANT
DRAPE SHEET LG 3/4 BI-LAMINATE (DRAPES) ×2 IMPLANT
DRAPE U-SHAPE 48X52 POLY STRL (PACKS) ×4 IMPLANT
DRILL RIGID 1.8MM FBRTK STRL (DRILL) ×2
ELECT REM PT RETURN 9FT ADLT (ELECTROSURGICAL) ×2
ELECTRODE REM PT RTRN 9FT ADLT (ELECTROSURGICAL) ×1 IMPLANT
GAUZE SPONGE 4X4 12PLY STRL (GAUZE/BANDAGES/DRESSINGS) ×2 IMPLANT
GAUZE XEROFORM 1X8 LF (GAUZE/BANDAGES/DRESSINGS) ×2 IMPLANT
GLOVE BIOGEL PI IND STRL 8 (GLOVE) ×2 IMPLANT
GLOVE BIOGEL PI INDICATOR 8 (GLOVE) ×2
GOWN STRL REUS W/ TWL LRG LVL3 (GOWN DISPOSABLE) ×1 IMPLANT
GOWN STRL REUS W/TWL LRG LVL3 (GOWN DISPOSABLE) ×1
IV LACTATED RINGER IRRG 3000ML (IV SOLUTION) ×10
IV LR IRRIG 3000ML ARTHROMATIC (IV SOLUTION) ×10 IMPLANT
KIT INSERTION 2.9 PUSHLOCK (KITS) ×2 IMPLANT
KIT STABILIZATION SHOULDER (MISCELLANEOUS) ×2 IMPLANT
KIT STR SPEAR 1.8 FBRTK DISP (KITS) ×2 IMPLANT
KIT TURNOVER KIT A (KITS) ×2 IMPLANT
MANIFOLD 4PT FOR NEPTUNE1 (MISCELLANEOUS) ×2 IMPLANT
MASK FACE SPIDER DISP (MASK) ×2 IMPLANT
MAT ABSORB  FLUID 56X50 GRAY (MISCELLANEOUS) ×2
MAT ABSORB FLUID 56X50 GRAY (MISCELLANEOUS) ×2 IMPLANT
NDL MAYO CATGUT SZ5 (NEEDLE) ×1
NDL SAFETY ECLIPSE 18X1.5 (NEEDLE) ×1 IMPLANT
NDL SUT 5 .5 CRC TPR PNT MAYO (NEEDLE) ×1 IMPLANT
NEEDLE HYPO 18GX1.5 SHARP (NEEDLE) ×1
NEEDLE HYPO 22GX1.5 SAFETY (NEEDLE) ×2 IMPLANT
NEEDLE MAYO CATGUT SZ 1.5 (NEEDLE) ×2
NEEDLE MAYO CATGUT SZ 2 (NEEDLE) ×1 IMPLANT
NEEDLE SCORPION MULTI FIRE (NEEDLE) ×2 IMPLANT
PACK ARTHROSCOPY SHOULDER (MISCELLANEOUS) ×2 IMPLANT
PAD WRAPON POLAR SHDR UNIV (MISCELLANEOUS) ×1 IMPLANT
PENCIL SMOKE EVACUATOR (MISCELLANEOUS) ×2 IMPLANT
SET TUBE SUCT SHAVER OUTFL 24K (TUBING) ×2 IMPLANT
SET TUBE TIP INTRA-ARTICULAR (MISCELLANEOUS) ×2 IMPLANT
STRIP CLOSURE SKIN 1/2X4 (GAUZE/BANDAGES/DRESSINGS) ×2 IMPLANT
SUT BONE WAX W31G (SUTURE) ×2 IMPLANT
SUT ETHILON 3-0 (SUTURE) ×2 IMPLANT
SUT MNCRL 4-0 (SUTURE) ×1
SUT MNCRL 4-0 27XMFL (SUTURE) ×1
SUT VIC AB 0 CT1 36 (SUTURE) ×2 IMPLANT
SUT VIC AB 2-0 CT2 27 (SUTURE) ×2 IMPLANT
SUTURE MNCRL 4-0 27XMF (SUTURE) ×1 IMPLANT
SUTURE TAPE 1.3 40 TPR END (SUTURE) ×2 IMPLANT
SUTURE TAPE FIBERLINK 1.3 LOOP (SUTURE) ×1 IMPLANT
SUTURETAPE 1.3 40 TPR END (SUTURE) ×4
SUTURETAPE FIBERLINK 1.3 LOOP (SUTURE) ×2
SYR 10ML LL (SYRINGE) ×2 IMPLANT
TUBING ARTHRO INFLOW-ONLY STRL (TUBING) ×2 IMPLANT
TUBING CONNECTING 10 (TUBING) IMPLANT
WAND WEREWOLF FLOW 90D (MISCELLANEOUS) ×2 IMPLANT
WRAPON POLAR PAD SHDR UNIV (MISCELLANEOUS) ×2

## 2019-05-09 NOTE — Anesthesia Procedure Notes (Signed)
Procedure Name: LMA Insertion Date/Time: 05/09/2019 12:41 PM Performed by: Jimmy Picket, CRNA Pre-anesthesia Checklist: Patient identified, Emergency Drugs available, Suction available, Timeout performed and Patient being monitored Patient Re-evaluated:Patient Re-evaluated prior to induction Oxygen Delivery Method: Circle system utilized Preoxygenation: Pre-oxygenation with 100% oxygen Induction Type: IV induction LMA: LMA inserted LMA Size: 4.0 Number of attempts: 1 Placement Confirmation: positive ETCO2 and breath sounds checked- equal and bilateral Tube secured with: Tape

## 2019-05-09 NOTE — Discharge Instructions (Signed)
Post-Op Instructions - Rotator Cuff Repair ° °1. Bracing: You will wear a shoulder immobilizer or sling for 6 weeks.  ° °2. Driving: No driving for 3 weeks post-op. When driving, do not wear the immobilizer. Ideally, we recommend no driving for 6 weeks while sling is in place as one arm will be immobilized.  ° °3. Activity: No active lifting for 2 months. Wrist, hand, and elbow motion only. Avoid lifting the upper arm away from the body except for hygiene. You are permitted to bend and straighten the elbow passively only (no active elbow motion). You may use your hand and wrist for typing, writing, and managing utensils (cutting food). Do not lift more than a coffee cup for 8 weeks.  When sleeping or resting, inclined positions (recliner chair or wedge pillow) and a pillow under the forearm for support may provide better comfort for up to 4 weeks.  Avoid long distance travel for 4 weeks. ° °Return to normal activities after rotator cuff repair repair normally takes 6 months on average. If rehab goes very well, may be able to do most activities at 4 months, except overhead or contact sports. ° °4. Physical Therapy: Begins 3-4 days after surgery, and proceed 1 time per week for the first 6 weeks, then 1-2 times per week from weeks 6-20 post-op. ° °5. Medications:  °- You will be provided a prescription for narcotic pain medicine. After surgery, take 1-2 narcotic tablets every 4 hours if needed for severe pain.  °- A prescription for anti-nausea medication will be provided in case the narcotic medicine causes nausea - take 1 tablet every 6 hours only if nauseated.   °- Take tylenol 1000 mg (2 Extra Strength tablets or 3 regular strength) every 8 hours for pain.  May decrease or stop tylenol 5 days after surgery if you are having minimal pain. °- Take ASA 325mg/day x 2 weeks to help prevent DVTs/PEs (blood clots).  °- DO NOT take ANY nonsteroidal anti-inflammatory pain medications (Advil, Motrin, Ibuprofen, Aleve,  Naproxen, or Naprosyn). These medicines can inhibit healing of your shoulder repair.  ° ° °If you are taking prescription medication for anxiety, depression, insomnia, muscle spasm, chronic pain, or for attention deficit disorder, you are advised that you are at a higher risk of adverse effects with use of narcotics post-op, including narcotic addiction/dependence, depressed breathing, death. °If you use non-prescribed substances: alcohol, marijuana, cocaine, heroin, methamphetamines, etc., you are at a higher risk of adverse effects with use of narcotics post-op, including narcotic addiction/dependence, depressed breathing, death. °You are advised that taking > 50 morphine milligram equivalents (MME) of narcotic pain medication per day results in twice the risk of overdose or death. For your prescription provided: oxycodone 5 mg - taking more than 6 tablets per day would result in > 50 morphine milligram equivalents (MME) of narcotic pain medication. °Be advised that we will prescribe narcotics short-term, for acute post-operative pain only - 3 weeks for major operations such as shoulder repair/reconstruction surgeries.  ° ° ° °6. Post-Op Appointment: ° °Your first post-op appointment will be 10-14 days post-op. ° °7. Work or School: For most, but not all procedures, we advise staying out of work or school for at least 1 to 2 weeks in order to recover from the stress of surgery and to allow time for healing.  ° °If you need a work or school note this can be provided.  ° °8. Smoking: If you are a smoker, you need to refrain from   smoking in the postoperative period. The nicotine in cigarettes will inhibit healing of your shoulder repair and decrease the chance of successful repair. Similarly, nicotine containing products (gum, patches) should be avoided.  ° °Post-operative Brace: °Apply and remove the brace you received as you were instructed to at the time of fitting and as described in detail as the brace’s  instructions for use indicate.  Wear the brace for the period of time prescribed by your physician.  The brace can be cleaned with soap and water and allowed to air dry only.  Should the brace result in increased pain, decreased feeling (numbness/tingling), increased swelling or an overall worsening of your medical condition, please contact your doctor immediately.  If an emergency situation occurs as a result of wearing the brace after normal business hours, please dial 911 and seek immediate medical attention.  Let your doctor know if you have any further questions about the brace issued to you. °Refer to the shoulder sling instructions for use if you have any questions regarding the correct fit of your shoulder sling.  °[facility] Customer Care for Troubleshooting: [phone] ° °Video that illustrates how to properly use a shoulder sling: °"Instructions for Proper Use of an Orthopaedic Sling" °https://www.youtube.com/watch?v=AHZpn_Xo45w ° °Information for Discharge Teaching: °EXPAREL (bupivacaine liposome injectable suspension)  ° °Your surgeon or anesthesiologist gave you EXPAREL(bupivacaine) to help control your pain after surgery.  °· EXPAREL is a local anesthetic that provides pain relief by numbing the tissue around the surgical site. °· EXPAREL is designed to release pain medication over time and can control pain for up to 72 hours. °· Depending on how you respond to EXPAREL, you may require less pain medication during your recovery. ° °Possible side effects: °· Temporary loss of sensation or ability to move in the area where bupivacaine was injected. °· Nausea, vomiting, constipation °· Rarely, numbness and tingling in your mouth or lips, lightheadedness, or anxiety may occur. °· Call your doctor right away if you think you may be experiencing any of these sensations, or if you have other questions regarding possible side effects. ° °Follow all other discharge instructions given to you by your surgeon or  nurse. Eat a healthy diet and drink plenty of water or other fluids. ° °If you return to the hospital for any reason within 96 hours following the administration of EXPAREL, it is important for health care providers to know that you have received this anesthetic. A teal colored band has been placed on your arm with the date, time and amount of EXPAREL you have received in order to alert and inform your health care providers. Please leave this armband in place for the full 96 hours following administration, and then you may remove the band. ° ° ° °General Anesthesia, Adult, Care After °This sheet gives you information about how to care for yourself after your procedure. Your health care provider may also give you more specific instructions. If you have problems or questions, contact your health care provider. °What can I expect after the procedure? °After the procedure, the following side effects are common: °· Pain or discomfort at the IV site. °· Nausea. °· Vomiting. °· Sore throat. °· Trouble concentrating. °· Feeling cold or chills. °· Weak or tired. °· Sleepiness and fatigue. °· Soreness and body aches. These side effects can affect parts of the body that were not involved in surgery. °Follow these instructions at home: ° °For at least 24 hours after the procedure: °· Have a responsible adult stay   with you. It is important to have someone help care for you until you are awake and alert. °· Rest as needed. °· Do not: °? Participate in activities in which you could fall or become injured. °? Drive. °? Use heavy machinery. °? Drink alcohol. °? Take sleeping pills or medicines that cause drowsiness. °? Make important decisions or sign legal documents. °? Take care of children on your own. °Eating and drinking °· Follow any instructions from your health care provider about eating or drinking restrictions. °· When you feel hungry, start by eating small amounts of foods that are soft and easy to digest (bland), such as  toast. Gradually return to your regular diet. °· Drink enough fluid to keep your urine pale yellow. °· If you vomit, rehydrate by drinking water, juice, or clear broth. °General instructions °· If you have sleep apnea, surgery and certain medicines can increase your risk for breathing problems. Follow instructions from your health care provider about wearing your sleep device: °? Anytime you are sleeping, including during daytime naps. °? While taking prescription pain medicines, sleeping medicines, or medicines that make you drowsy. °· Return to your normal activities as told by your health care provider. Ask your health care provider what activities are safe for you. °· Take over-the-counter and prescription medicines only as told by your health care provider. °· If you smoke, do not smoke without supervision. °· Keep all follow-up visits as told by your health care provider. This is important. °Contact a health care provider if: °· You have nausea or vomiting that does not get better with medicine. °· You cannot eat or drink without vomiting. °· You have pain that does not get better with medicine. °· You are unable to pass urine. °· You develop a skin rash. °· You have a fever. °· You have redness around your IV site that gets worse. °Get help right away if: °· You have difficulty breathing. °· You have chest pain. °· You have blood in your urine or stool, or you vomit blood. °Summary °· After the procedure, it is common to have a sore throat or nausea. It is also common to feel tired. °· Have a responsible adult stay with you for the first 24 hours after general anesthesia. It is important to have someone help care for you until you are awake and alert. °· When you feel hungry, start by eating small amounts of foods that are soft and easy to digest (bland), such as toast. Gradually return to your regular diet. °· Drink enough fluid to keep your urine pale yellow. °· Return to your normal activities as told by  your health care provider. Ask your health care provider what activities are safe for you. °This information is not intended to replace advice given to you by your health care provider. Make sure you discuss any questions you have with your health care provider. °Document Released: 03/06/2001 Document Revised: 07/14/2017 Document Reviewed: 07/14/2017 °Elsevier Interactive Patient Education © 2019 [employer] ° °

## 2019-05-09 NOTE — Anesthesia Preprocedure Evaluation (Signed)
Anesthesia Evaluation  Patient identified by MRN, date of birth, ID band Patient awake    Reviewed: Allergy & Precautions, H&P , NPO status , Patient's Chart, lab work & pertinent test results  Airway Mallampati: II  TM Distance: >3 FB Neck ROM: full    Dental no notable dental hx.    Pulmonary    Pulmonary exam normal breath sounds clear to auscultation       Cardiovascular Normal cardiovascular exam Rhythm:regular Rate:Normal     Neuro/Psych    GI/Hepatic   Endo/Other    Renal/GU      Musculoskeletal   Abdominal   Peds  Hematology   Anesthesia Other Findings   Reproductive/Obstetrics                             Anesthesia Physical Anesthesia Plan  ASA: II  Anesthesia Plan: General LMA   Post-op Pain Management:  Regional for Post-op pain   Induction:   PONV Risk Score and Plan: 2 and Ondansetron, Dexamethasone, Midazolam and Treatment may vary due to age or medical condition  Airway Management Planned:   Additional Equipment:   Intra-op Plan:   Post-operative Plan:   Informed Consent: I have reviewed the patients History and Physical, chart, labs and discussed the procedure including the risks, benefits and alternatives for the proposed anesthesia with the patient or authorized representative who has indicated his/her understanding and acceptance.       Plan Discussed with: CRNA  Anesthesia Plan Comments:         Anesthesia Quick Evaluation

## 2019-05-09 NOTE — Anesthesia Postprocedure Evaluation (Signed)
Anesthesia Post Note  Patient: Bruce Diaz  Procedure(s) Performed: SHOULDER ARTHROSCOPY WITH MINI  OPEN ROTATOR CUFF REPAIR, BICEPS TENDOSIS DISTAL CLAVICLE EXCISION SUBSCROMIAL DECOMPRESSION (Right Shoulder)  Patient location during evaluation: PACU Anesthesia Type: General Level of consciousness: awake and alert and oriented Pain management: satisfactory to patient Vital Signs Assessment: post-procedure vital signs reviewed and stable Respiratory status: spontaneous breathing, nonlabored ventilation and respiratory function stable Cardiovascular status: blood pressure returned to baseline and stable Postop Assessment: Adequate PO intake and No signs of nausea or vomiting Anesthetic complications: no    Cherly Beach

## 2019-05-09 NOTE — Anesthesia Procedure Notes (Signed)
Anesthesia Regional Block: Interscalene brachial plexus block   Pre-Anesthetic Checklist: ,, timeout performed, Correct Patient, Correct Site, Correct Laterality, Correct Procedure, Correct Position, site marked, Risks and benefits discussed,  Surgical consent,  Pre-op evaluation,  At surgeon's request and post-op pain management  Laterality: Right  Prep: chloraprep       Needles:  Injection technique: Single-shot  Needle Type: Stimiplex     Needle Length: 10cm  Needle Gauge: 21     Additional Needles:   Procedures:,,,, ultrasound used (permanent image in chart),,,,  Narrative:  Start time: 05/09/2019 11:45 AM End time: 05/09/2019 11:51 AM Injection made incrementally with aspirations every 5 mL.  Performed by: Personally  Anesthesiologist: Ranee Gosselin, MD  Additional Notes: Functioning IV was confirmed and monitors applied. Ultrasound guidance: relevant anatomy identified, needle position confirmed, local anesthetic spread visualized around nerve(s)., vascular puncture avoided.  Image printed for medical record.  Negative aspiration and no paresthesias; incremental administration of local anesthetic. The patient tolerated the procedure well. Vitals signes recorded in RN notes.

## 2019-05-09 NOTE — Transfer of Care (Signed)
Immediate Anesthesia Transfer of Care Note  Patient: Bruce Diaz  Procedure(s) Performed: SHOULDER ARTHROSCOPY WITH MINI  OPEN ROTATOR CUFF REPAIR, BICEPS TENDOSIS DISTAL CLAVICLE EXCISION SUBSCROMIAL DECOMPRESSION (Right Shoulder)  Patient Location: PACU  Anesthesia Type: General LMA  Level of Consciousness: awake, alert  and patient cooperative  Airway and Oxygen Therapy: Patient Spontanous Breathing and Patient connected to supplemental oxygen  Post-op Assessment: Post-op Vital signs reviewed, Patient's Cardiovascular Status Stable, Respiratory Function Stable, Patent Airway and No signs of Nausea or vomiting  Post-op Vital Signs: Reviewed and stable  Complications: No apparent anesthesia complications

## 2019-05-09 NOTE — Op Note (Signed)
SURGERY DATE: 05/09/2019  PRE-OP DIAGNOSIS:  1. Right subacromial impingement 2. Right biceps tendinopathy 3. Right rotator cuff tear  POST-OP DIAGNOSIS: 1. Right subacromial impingement 2. Right biceps tendinopathy 3. Right rotator cuff tear  PROCEDURES:  1. Right mini-open rotator cuff repair 2. Right open biceps tenodesis 3. Right arthroscopic extensive debridement of shoulder (glenohumeral and subacromial spaces) 4. Right arthroscopic subacromial decompression  SURGEON: Rosealee Albee, MD  ANESTHESIA: Gen with Exparil interscalene block  ESTIMATED BLOOD LOSS: 25cc  DRAINS:  none  TOTAL IV FLUIDS: per anesthesia   SPECIMENS: none  IMPLANTS:  - Arthrex 4.70mm SwiveLock x2 - Arthrex 1.70mm FiberTak - Iconix SPEED double loaded with 2.66mm tapes x2   OPERATIVE FINDINGS:  Examination under anesthesia: A careful examination under anesthesia was performed.  Passive range of motion was: FF: 150; ER at side: 45; ER in abduction: 90; IR in abduction: 50.  Anterior load shift: NT.  Posterior load shift: NT.  Sulcus in neutral: NT.  Sulcus in ER: NT.    Intra-operative findings: A thorough arthroscopic examination of the shoulder was performed.  The findings are: 1. Biceps tendon: tendinopathy with longitudinal split tear/fraying  2. Superior labrum: injected with surrounding synovitis 3. Posterior labrum and capsule: normal 4. Inferior capsule and inferior recess: normal 5. Glenoid cartilage surface: Grade 1 changes with mild surface fibrillation  6. Supraspinatus attachment: full-thickness tear 7. Posterior rotator cuff attachment: normal 8. Humeral head articular cartilage: normal 9. Rotator interval: significant synovitis 10: Subscapularis tendon: attachment intact 11. Anterior labrum: degenerative 12. IGHL: normal  OPERATIVE REPORT:   Indications for procedure: SAMRIDH NABA is a 53 y.o. male with ~9 months of L shoulder pain that has failed non-operative management  including activity modification, physical therapy, medical management and corticosteroid injection without adequate relief of symptoms. Clinical exam and MRI were suggestive of rotator cuff tear, biceps tendinopathy, subacromial impingement, and acromioclavicular joint arthritis. After discussion of risks, benefits, and alternatives to surgery, the patient elected to proceed.   Procedure in detail:  I identified Maida Sale in the pre-operative holding area.  I marked the operative shoulder with my initials. I reviewed the risks and benefits of the proposed surgical intervention, and the patient (and/or patient's guardian) wished to proceed.  Anesthesia was then performed with an Exparil interscalene block.  The patient was transferred to the operative suite and placed in the beach chair position.    SCDs were placed on the lower extremities. Appropriate IV antibiotics were administered prior to incision. The operative upper extremity was then prepped and draped in standard fashion. A time out was performed confirming the correct extremity, correct patient, and correct procedure.   I then created a standard posterior portal with an 11 blade. The glenohumeral joint was easily entered with a blunt trochar and the arthroscope introduced. The findings of diagnostic arthroscopy are described above. I debrided degenerative tissue including the synovitic tissue about the rotator interval and anterior and superior labrum. I then coagulated the inflamed synovium to obtain hemostasis and reduce the risk of post-operative swelling using an Arthrocare radiofrequency device. I performed a biceps tenotomy using an Arthrocare wand and used a motorized shaver to debride the stump back to a stable base.   Next, the arthroscope was then introduced into the subacromial space. A direct lateral portal was created with an 11-blade after spinal needle localization. An extensive subacromial bursectomy was performed using a  combination of the shaver and Arthrocare wand. The entire acromial undersurface was  exposed and the CA ligament was subperiosteally elevated to expose the anterior acromial hook. A 5.325mm barrel burr was used to create a flat anterior and lateral aspect of the acromion, converting it from a Type 2 to a Type 1 acromion. Care was made to keep the deltoid fascia intact.  A longitudinal incision from the anterolateral acromion ~7cm in length was made overlying the raphe between the anterior and middle heads of the deltoid. The raphe was identified and it was incised. The subacromial space was identified. Any remaining bursa was excised. The rotator cuff tear was identified. It was an U-shaped tear with horizontal delamination of rotator cuff.    We then turned our attention to the biceps tenodesis. The arm was externally rotated.  The bicipital groove was identified.  A 15 blade was used to make a cut overlying the biceps tendon, and the tendon was removed using a right angle clamp.  The base of the bicipital groove was identified and cleared of soft tissue.  A FiberTak anchor was placed in the bicipital groove.  The biceps tendon was held at the appropriate amount of tension.  One set of sutures was passed through the biceps anchor with one limb passed in a simple fashion and the second limb passed in a simple plus locking stitch pattern.  This was repeated for the other set of sutures.  This construct allowed for shuttling the biceps tendon down to the bone.  The sutures were tied and cut.  The diseased portion of the proximal biceps was then excised.  The arm was then internally rotated.  The rotator cuff footprint was cleared of soft tissue. A rongeur was used to gently decorticate the rotator cuff footprint to allow for improved healing. Two Iconix SPEED anchors were placed just lateral to the articular margin.  The rotator cuff was mobilized using key elevators both superior and inferior to the tear. The  rotator cuff was able to be reduced to its footprint and then held in a reduced position with graspers. All 8 strands of suture from the medial row anchors were passed through the rotator cuff in an appropriate fashion. The posterior sutures from each anchor were tied with a knot pusher, thus reducing the rotator cuff. Two SwiveLock anchors were placed for the lateral row anchors with one limb of each of the remaining two medial row sutures passed through each anchor.  This allowed for reapproximation of the rotator cuff over its footprint. The posterior anchor #2 FiberWire from the SwiveLock anchor was additionally passed through a dog ear in the posterior aspect of the rotator cuff to further reapproximate the tear. The anterior anchor #2 FiberWire was used to place a side-to-side stitch with one limb in the posterior cuff and one limb of the anterior cuff to close down a small gap there.  This construct was stable with external and internal rotation and allowed for appropriate reapproximation of the rotator cuff to its natural footprint..  The wound was thoroughly irrigated.  The deltoid split was closed with 0 Vicryl.  The subdermal layer was closed with 2-0 Vicryl.  The skin was closed with staples. The portals were closed with 3-0 Nylon. Xeroform was applied to the incisions. A sterile dressing was applied, followed by a Polar Care sleeve and a SlingShot shoulder immobilizer/sling. The patient was awakened from anesthesia without difficulty and was transferred to the PACU in stable condition.     COMPLICATIONS: none  DISPOSITION: plan for discharge home after recovery in  PACU   POSTOPERATIVE PLAN: Remain in sling (except hygiene and elbow/wrist/hand RoM exercises as instructed by PT) x 6 weeks and NWB for this time. PT to begin 3-4 days after surgery. Rotator cuff repair and biceps tenodesis rehab protocol. ASA  daily x 2 weeks for DVT ppx.

## 2019-05-09 NOTE — H&P (Signed)
Paper H&P to be scanned into permanent record. H&P reviewed. No significant changes noted.  

## 2019-05-10 ENCOUNTER — Encounter: Payer: Self-pay | Admitting: Orthopedic Surgery

## 2019-05-13 DIAGNOSIS — M25611 Stiffness of right shoulder, not elsewhere classified: Secondary | ICD-10-CM | POA: Diagnosis not present

## 2019-05-13 DIAGNOSIS — M6281 Muscle weakness (generalized): Secondary | ICD-10-CM | POA: Diagnosis not present

## 2019-05-13 DIAGNOSIS — M25511 Pain in right shoulder: Secondary | ICD-10-CM | POA: Diagnosis not present

## 2019-05-13 DIAGNOSIS — Z9889 Other specified postprocedural states: Secondary | ICD-10-CM | POA: Diagnosis not present

## 2019-05-14 NOTE — Progress Notes (Signed)
Patient's wife called today to report a rash that has whelps and itching on patient's shoulder where the blue towel touched to protect his skin from the polar care wrap.  Instructed her to call Dr. Eliane Decree office regarding rash.

## 2019-05-22 DIAGNOSIS — Z9889 Other specified postprocedural states: Secondary | ICD-10-CM | POA: Diagnosis not present

## 2019-05-22 DIAGNOSIS — M25511 Pain in right shoulder: Secondary | ICD-10-CM | POA: Diagnosis not present

## 2019-05-30 DIAGNOSIS — Z9889 Other specified postprocedural states: Secondary | ICD-10-CM | POA: Diagnosis not present

## 2019-06-06 DIAGNOSIS — Z9889 Other specified postprocedural states: Secondary | ICD-10-CM | POA: Diagnosis not present

## 2019-06-10 DIAGNOSIS — M25611 Stiffness of right shoulder, not elsewhere classified: Secondary | ICD-10-CM | POA: Diagnosis not present

## 2019-06-10 DIAGNOSIS — M6281 Muscle weakness (generalized): Secondary | ICD-10-CM | POA: Diagnosis not present

## 2019-06-10 DIAGNOSIS — M25511 Pain in right shoulder: Secondary | ICD-10-CM | POA: Diagnosis not present

## 2019-06-10 DIAGNOSIS — Z9889 Other specified postprocedural states: Secondary | ICD-10-CM | POA: Diagnosis not present

## 2019-06-13 DIAGNOSIS — M6281 Muscle weakness (generalized): Secondary | ICD-10-CM | POA: Diagnosis not present

## 2019-06-13 DIAGNOSIS — M25511 Pain in right shoulder: Secondary | ICD-10-CM | POA: Diagnosis not present

## 2019-06-13 DIAGNOSIS — Z9889 Other specified postprocedural states: Secondary | ICD-10-CM | POA: Diagnosis not present

## 2019-06-13 DIAGNOSIS — M25611 Stiffness of right shoulder, not elsewhere classified: Secondary | ICD-10-CM | POA: Diagnosis not present

## 2019-06-18 DIAGNOSIS — Z9889 Other specified postprocedural states: Secondary | ICD-10-CM | POA: Diagnosis not present

## 2019-06-20 DIAGNOSIS — M6281 Muscle weakness (generalized): Secondary | ICD-10-CM | POA: Diagnosis not present

## 2019-06-20 DIAGNOSIS — Z9889 Other specified postprocedural states: Secondary | ICD-10-CM | POA: Diagnosis not present

## 2019-06-20 DIAGNOSIS — M25511 Pain in right shoulder: Secondary | ICD-10-CM | POA: Diagnosis not present

## 2019-06-20 DIAGNOSIS — M25611 Stiffness of right shoulder, not elsewhere classified: Secondary | ICD-10-CM | POA: Diagnosis not present

## 2019-06-25 DIAGNOSIS — M25511 Pain in right shoulder: Secondary | ICD-10-CM | POA: Diagnosis not present

## 2019-06-25 DIAGNOSIS — M25611 Stiffness of right shoulder, not elsewhere classified: Secondary | ICD-10-CM | POA: Diagnosis not present

## 2019-06-25 DIAGNOSIS — Z9889 Other specified postprocedural states: Secondary | ICD-10-CM | POA: Diagnosis not present

## 2019-06-25 DIAGNOSIS — M6281 Muscle weakness (generalized): Secondary | ICD-10-CM | POA: Diagnosis not present

## 2019-06-28 DIAGNOSIS — Z9889 Other specified postprocedural states: Secondary | ICD-10-CM | POA: Diagnosis not present

## 2019-07-01 DIAGNOSIS — Z9889 Other specified postprocedural states: Secondary | ICD-10-CM | POA: Diagnosis not present

## 2019-07-01 DIAGNOSIS — M25511 Pain in right shoulder: Secondary | ICD-10-CM | POA: Diagnosis not present

## 2019-07-03 DIAGNOSIS — M25511 Pain in right shoulder: Secondary | ICD-10-CM | POA: Diagnosis not present

## 2019-07-03 DIAGNOSIS — Z9889 Other specified postprocedural states: Secondary | ICD-10-CM | POA: Diagnosis not present

## 2019-07-08 DIAGNOSIS — T1501XA Foreign body in cornea, right eye, initial encounter: Secondary | ICD-10-CM | POA: Diagnosis not present

## 2019-07-11 DIAGNOSIS — Z9889 Other specified postprocedural states: Secondary | ICD-10-CM | POA: Diagnosis not present

## 2019-07-11 DIAGNOSIS — M25611 Stiffness of right shoulder, not elsewhere classified: Secondary | ICD-10-CM | POA: Diagnosis not present

## 2019-07-11 DIAGNOSIS — M6281 Muscle weakness (generalized): Secondary | ICD-10-CM | POA: Diagnosis not present

## 2019-07-11 DIAGNOSIS — M25511 Pain in right shoulder: Secondary | ICD-10-CM | POA: Diagnosis not present

## 2019-07-17 DIAGNOSIS — Z9889 Other specified postprocedural states: Secondary | ICD-10-CM | POA: Diagnosis not present

## 2019-07-17 DIAGNOSIS — M25511 Pain in right shoulder: Secondary | ICD-10-CM | POA: Diagnosis not present

## 2019-07-19 DIAGNOSIS — M25611 Stiffness of right shoulder, not elsewhere classified: Secondary | ICD-10-CM | POA: Diagnosis not present

## 2019-07-19 DIAGNOSIS — M25511 Pain in right shoulder: Secondary | ICD-10-CM | POA: Diagnosis not present

## 2019-07-19 DIAGNOSIS — Z9889 Other specified postprocedural states: Secondary | ICD-10-CM | POA: Diagnosis not present

## 2019-07-19 DIAGNOSIS — M6281 Muscle weakness (generalized): Secondary | ICD-10-CM | POA: Diagnosis not present

## 2019-07-24 DIAGNOSIS — Z1159 Encounter for screening for other viral diseases: Secondary | ICD-10-CM | POA: Diagnosis not present

## 2019-07-29 DIAGNOSIS — Z9889 Other specified postprocedural states: Secondary | ICD-10-CM | POA: Diagnosis not present

## 2019-07-31 DIAGNOSIS — M6281 Muscle weakness (generalized): Secondary | ICD-10-CM | POA: Diagnosis not present

## 2019-07-31 DIAGNOSIS — Z9889 Other specified postprocedural states: Secondary | ICD-10-CM | POA: Diagnosis not present

## 2019-07-31 DIAGNOSIS — M25611 Stiffness of right shoulder, not elsewhere classified: Secondary | ICD-10-CM | POA: Diagnosis not present

## 2019-07-31 DIAGNOSIS — M25511 Pain in right shoulder: Secondary | ICD-10-CM | POA: Diagnosis not present

## 2019-08-06 DIAGNOSIS — M6281 Muscle weakness (generalized): Secondary | ICD-10-CM | POA: Diagnosis not present

## 2019-08-06 DIAGNOSIS — M25611 Stiffness of right shoulder, not elsewhere classified: Secondary | ICD-10-CM | POA: Diagnosis not present

## 2019-08-06 DIAGNOSIS — Z9889 Other specified postprocedural states: Secondary | ICD-10-CM | POA: Diagnosis not present

## 2019-08-06 DIAGNOSIS — M25511 Pain in right shoulder: Secondary | ICD-10-CM | POA: Diagnosis not present

## 2019-08-08 DIAGNOSIS — M25511 Pain in right shoulder: Secondary | ICD-10-CM | POA: Diagnosis not present

## 2019-08-08 DIAGNOSIS — M25611 Stiffness of right shoulder, not elsewhere classified: Secondary | ICD-10-CM | POA: Diagnosis not present

## 2019-08-08 DIAGNOSIS — M6281 Muscle weakness (generalized): Secondary | ICD-10-CM | POA: Diagnosis not present

## 2019-08-08 DIAGNOSIS — Z9889 Other specified postprocedural states: Secondary | ICD-10-CM | POA: Diagnosis not present

## 2019-08-12 DIAGNOSIS — M25611 Stiffness of right shoulder, not elsewhere classified: Secondary | ICD-10-CM | POA: Diagnosis not present

## 2019-08-12 DIAGNOSIS — M6281 Muscle weakness (generalized): Secondary | ICD-10-CM | POA: Diagnosis not present

## 2019-08-12 DIAGNOSIS — M25511 Pain in right shoulder: Secondary | ICD-10-CM | POA: Diagnosis not present

## 2019-08-12 DIAGNOSIS — Z9889 Other specified postprocedural states: Secondary | ICD-10-CM | POA: Diagnosis not present

## 2019-08-16 DIAGNOSIS — M25511 Pain in right shoulder: Secondary | ICD-10-CM | POA: Diagnosis not present

## 2019-08-16 DIAGNOSIS — M25611 Stiffness of right shoulder, not elsewhere classified: Secondary | ICD-10-CM | POA: Diagnosis not present

## 2019-08-16 DIAGNOSIS — Z9889 Other specified postprocedural states: Secondary | ICD-10-CM | POA: Diagnosis not present

## 2019-08-16 DIAGNOSIS — M6281 Muscle weakness (generalized): Secondary | ICD-10-CM | POA: Diagnosis not present

## 2019-08-27 ENCOUNTER — Other Ambulatory Visit: Payer: Self-pay

## 2019-08-27 ENCOUNTER — Encounter
Admission: RE | Admit: 2019-08-27 | Discharge: 2019-08-27 | Disposition: A | Discharge status: Home / Self Care | Payer: BC Managed Care – PPO | Source: Ambulatory Visit | Attending: Orthopedic Surgery | Admitting: Orthopedic Surgery

## 2019-08-27 HISTORY — DX: Gastro-esophageal reflux disease without esophagitis: K21.9

## 2019-08-27 NOTE — Patient Instructions (Signed)
Your procedure is scheduled on: Monday 09/02/19.  Report to DAY SURGERY DEPARTMENT LOCATED ON 2ND FLOOR MEDICAL MALL ENTRANCE. To find out your arrival time please call [phone] between 1PM - 3PM on Friday 08/30/19.   Remember: Instructions that are not followed completely may result in serious medical risk, up to and including death, or upon the discretion of your surgeon and anesthesiologist your surgery may need to be rescheduled.      _X__ 1. Do not eat food after midnight the night before your procedure.                 No gum chewing or hard candies. You may drink clear liquids up to 2 hours                 before you are scheduled to arrive for your surgery- DO NOT drink clear                 liquids within 2 hours of the start of your surgery.                 Clear Liquids include:  water, apple juice without pulp, clear carbohydrate                 drink such as Clearfast or Gatorade, Black Coffee or Tea (Do not add                 anything to coffee or tea).   __X__2.  On the morning of surgery brush your teeth with toothpaste and water, you may rinse your mouth with mouthwash if you wish.  Do not swallow any toothpaste or mouthwash.      __X__3.  Notify your doctor if there is any change in your medical condition      (cold, fever, infections).      Do not wear jewelry, make-up, hairpins, clips or nail polish. Do not wear lotions, powders, or perfumes.  Do not shave 48 hours prior to surgery. Men may shave face and neck. Do not bring valuables to the hospital.     [facility] is not responsible for any belongings or valuables.   Contacts, dentures/partials or body piercings may not be worn into surgery. Bring a case for your contacts, glasses or hearing aids, a denture cup will be supplied.    Patients discharged the day of surgery will not be allowed to drive home.    __X__ Take these medicines the morning of surgery with A SIP OF WATER:     1.  NONE    __X__ Use CHG Soap as directed    __X__ Stop Anti-inflammatories 7 days before surgery such as Advil, Ibuprofen, Motrin, BC or Goodies Powder, Naprosyn, Naproxen, Aleve, Aspirin, Meloxicam. May take Tylenol if needed for pain or discomfort.    __X__ Don't begin taking any new herbal supplements before your surgery.

## 2019-08-28 DIAGNOSIS — M75122 Complete rotator cuff tear or rupture of left shoulder, not specified as traumatic: Secondary | ICD-10-CM | POA: Diagnosis not present

## 2019-08-29 ENCOUNTER — Other Ambulatory Visit: Payer: Self-pay

## 2019-08-29 ENCOUNTER — Other Ambulatory Visit
Admission: RE | Admit: 2019-08-29 | Discharge: 2019-08-29 | Disposition: A | Discharge status: Home / Self Care | Payer: BC Managed Care – PPO | Source: Ambulatory Visit | Attending: Orthopedic Surgery | Admitting: Orthopedic Surgery

## 2019-08-29 DIAGNOSIS — Z20828 Contact with and (suspected) exposure to other viral communicable diseases: Secondary | ICD-10-CM | POA: Insufficient documentation

## 2019-08-29 DIAGNOSIS — Z01812 Encounter for preprocedural laboratory examination: Secondary | ICD-10-CM | POA: Insufficient documentation

## 2019-08-29 LAB — BASIC METABOLIC PANEL
Anion gap: 10 (ref 5–15)
BUN: 14 mg/dL (ref 6–20)
CO2: 28 mmol/L (ref 22–32)
Calcium: 9.6 mg/dL (ref 8.9–10.3)
Chloride: 100 mmol/L (ref 98–111)
Creatinine, Ser: 1.17 mg/dL (ref 0.61–1.24)
GFR calc Af Amer: >60 mL/min (ref >60)
GFR calc non Af Amer: >60 mL/min (ref >60)
Glucose, Bld: 92 mg/dL (ref 70–99)
Potassium: 4.3 mmol/L (ref 3.5–5.1)
Sodium: 138 mmol/L (ref 135–145)

## 2019-08-29 LAB — CBC
HCT: 47.6 % (ref 39.0–52.0)
Hemoglobin: 15.6 g/dL (ref 13.0–17.0)
MCH: 28.9 pg (ref 26.0–34.0)
MCHC: 32.8 g/dL (ref 30.0–36.0)
MCV: 88.1 fL (ref 80.0–100.0)
Platelets: 329 10*3/uL (ref 150–400)
RBC: 5.4 MIL/uL (ref 4.22–5.81)
RDW: 13.3 % (ref 11.5–15.5)
WBC: 7.4 10*3/uL (ref 4.0–10.5)
nRBC: 0 % (ref 0.0–0.2)

## 2019-08-29 LAB — SARS CORONAVIRUS 2 (TAT 6-24 HRS): SARS Coronavirus 2: NEGATIVE

## 2019-08-30 ENCOUNTER — Other Ambulatory Visit: Payer: BLUE CROSS/BLUE SHIELD

## 2019-09-02 ENCOUNTER — Ambulatory Visit: Payer: BC Managed Care – PPO | Admitting: Anesthesiology

## 2019-09-02 ENCOUNTER — Encounter
Admission: RE | Disposition: A | Discharge status: Home / Self Care | Payer: Self-pay | Source: Home / Self Care | Attending: Orthopedic Surgery

## 2019-09-02 ENCOUNTER — Encounter: Payer: Self-pay | Admitting: *Deleted

## 2019-09-02 ENCOUNTER — Ambulatory Visit: Payer: Self-pay

## 2019-09-02 ENCOUNTER — Ambulatory Visit
Admission: RE | Admit: 2019-09-02 | Discharge: 2019-09-02 | Disposition: A | Discharge status: Home / Self Care | Payer: BC Managed Care – PPO | Attending: Orthopedic Surgery | Admitting: Orthopedic Surgery

## 2019-09-02 ENCOUNTER — Other Ambulatory Visit: Payer: Self-pay

## 2019-09-02 DIAGNOSIS — Z791 Long term (current) use of non-steroidal anti-inflammatories (NSAID): Secondary | ICD-10-CM | POA: Diagnosis not present

## 2019-09-02 DIAGNOSIS — M75122 Complete rotator cuff tear or rupture of left shoulder, not specified as traumatic: Secondary | ICD-10-CM | POA: Diagnosis not present

## 2019-09-02 DIAGNOSIS — K219 Gastro-esophageal reflux disease without esophagitis: Secondary | ICD-10-CM | POA: Diagnosis not present

## 2019-09-02 DIAGNOSIS — M25812 Other specified joint disorders, left shoulder: Secondary | ICD-10-CM | POA: Diagnosis not present

## 2019-09-02 DIAGNOSIS — M7522 Bicipital tendinitis, left shoulder: Secondary | ICD-10-CM | POA: Diagnosis not present

## 2019-09-02 DIAGNOSIS — M75102 Unspecified rotator cuff tear or rupture of left shoulder, not specified as traumatic: Secondary | ICD-10-CM | POA: Diagnosis not present

## 2019-09-02 DIAGNOSIS — M7582 Other shoulder lesions, left shoulder: Secondary | ICD-10-CM | POA: Diagnosis not present

## 2019-09-02 DIAGNOSIS — M65812 Other synovitis and tenosynovitis, left shoulder: Secondary | ICD-10-CM | POA: Diagnosis not present

## 2019-09-02 DIAGNOSIS — G8918 Other acute postprocedural pain: Secondary | ICD-10-CM | POA: Diagnosis not present

## 2019-09-02 DIAGNOSIS — M25512 Pain in left shoulder: Secondary | ICD-10-CM | POA: Diagnosis not present

## 2019-09-02 DIAGNOSIS — M7542 Impingement syndrome of left shoulder: Secondary | ICD-10-CM | POA: Diagnosis not present

## 2019-09-02 HISTORY — PX: SHOULDER ARTHROSCOPY WITH OPEN ROTATOR CUFF REPAIR: SHX6092

## 2019-09-02 SURGERY — ARTHROSCOPY, SHOULDER WITH REPAIR, ROTATOR CUFF, OPEN
Anesthesia: General | Laterality: Left

## 2019-09-02 MED ORDER — ONDANSETRON HCL 4 MG/2ML IJ SOLN
INTRAMUSCULAR | Status: DC | PRN
  Administered 2019-09-02: 4 mg via INTRAVENOUS

## 2019-09-02 MED ORDER — ROCURONIUM BROMIDE 100 MG/10ML IV SOLN
INTRAVENOUS | Status: DC | PRN
  Administered 2019-09-02: 50 mg via INTRAVENOUS

## 2019-09-02 MED ORDER — BUPIVACAINE HCL (PF) 0.5 % IJ SOLN
INTRAMUSCULAR | Status: AC
  Filled 2019-09-02: qty 10

## 2019-09-02 MED ORDER — MIDAZOLAM HCL 2 MG/2ML IJ SOLN
2.0000 mg | Freq: Once | INTRAMUSCULAR | Status: AC
  Administered 2019-09-02: 11:00:00 2 mg via INTRAVENOUS

## 2019-09-02 MED ORDER — LIDOCAINE HCL (CARDIAC) PF 100 MG/5ML IV SOSY
PREFILLED_SYRINGE | INTRAVENOUS | Status: DC | PRN
  Administered 2019-09-02: 100 mg via INTRAVENOUS

## 2019-09-02 MED ORDER — FENTANYL CITRATE (PF) 100 MCG/2ML IJ SOLN
INTRAMUSCULAR | Status: AC
  Filled 2019-09-02: qty 2

## 2019-09-02 MED ORDER — CEFAZOLIN SODIUM-DEXTROSE 2-4 GM/100ML-% IV SOLN
2.0000 g | Freq: Once | INTRAVENOUS | Status: AC
  Administered 2019-09-02: 2 g via INTRAVENOUS

## 2019-09-02 MED ORDER — BUPIVACAINE HCL (PF) 0.5 % IJ SOLN
INTRAMUSCULAR | Status: DC | PRN
  Administered 2019-09-02: 4 mL
  Administered 2019-09-02: 10 mL via PERINEURAL

## 2019-09-02 MED ORDER — ACETAMINOPHEN 500 MG PO TABS: 1000.0000 mg | ORAL_TABLET | Freq: Three times a day (TID) | ORAL | 2 refills | Status: AC

## 2019-09-02 MED ORDER — SUGAMMADEX SODIUM 200 MG/2ML IV SOLN
INTRAVENOUS | Status: DC | PRN
  Administered 2019-09-02: 200 mg via INTRAVENOUS

## 2019-09-02 MED ORDER — FAMOTIDINE 20 MG PO TABS
ORAL_TABLET | ORAL | Status: AC
  Filled 2019-09-02: qty 1

## 2019-09-02 MED ORDER — EPINEPHRINE PF 1 MG/ML IJ SOLN
INTRAMUSCULAR | Status: AC
  Filled 2019-09-02: qty 4

## 2019-09-02 MED ORDER — MIDAZOLAM HCL 2 MG/2ML IJ SOLN
INTRAMUSCULAR | Status: AC
  Filled 2019-09-02: qty 2

## 2019-09-02 MED ORDER — LIDOCAINE-EPINEPHRINE 1 %-1:100000 IJ SOLN
INTRAMUSCULAR | Status: DC | PRN
  Administered 2019-09-02: 5 mL

## 2019-09-02 MED ORDER — LACTATED RINGERS IV SOLN
INTRAVENOUS | Status: DC | PRN
  Administered 2019-09-02: 4 mL

## 2019-09-02 MED ORDER — BUPIVACAINE LIPOSOME 1.3 % IJ SUSP
INTRAMUSCULAR | Status: AC
  Filled 2019-09-02: qty 20

## 2019-09-02 MED ORDER — MIDAZOLAM HCL 2 MG/2ML IJ SOLN
INTRAMUSCULAR | Status: AC
  Administered 2019-09-02: 2 mg via INTRAVENOUS
  Filled 2019-09-02: qty 2

## 2019-09-02 MED ORDER — PROPOFOL 10 MG/ML IV BOLUS
INTRAVENOUS | Status: DC | PRN
  Administered 2019-09-02: 200 mg via INTRAVENOUS

## 2019-09-02 MED ORDER — ASPIRIN EC 325 MG PO TBEC: 325.0000 mg | DELAYED_RELEASE_TABLET | Freq: Every day | ORAL | 0 refills | Status: AC

## 2019-09-02 MED ORDER — MIDAZOLAM HCL 2 MG/2ML IJ SOLN
INTRAMUSCULAR | Status: DC | PRN
  Administered 2019-09-02: 2 mg via INTRAVENOUS

## 2019-09-02 MED ORDER — OXYCODONE HCL 5 MG PO TABS: 5.0000 mg | ORAL_TABLET | ORAL | 0 refills | Status: AC | PRN

## 2019-09-02 MED ORDER — LACTATED RINGERS IV SOLN
INTRAVENOUS | Status: DC
  Administered 2019-09-02 (×2): via INTRAVENOUS

## 2019-09-02 MED ORDER — DEXAMETHASONE SODIUM PHOSPHATE 10 MG/ML IJ SOLN
INTRAMUSCULAR | Status: DC | PRN
  Administered 2019-09-02: 10 mg via INTRAVENOUS

## 2019-09-02 MED ORDER — LIDOCAINE-EPINEPHRINE 1 %-1:100000 IJ SOLN
INTRAMUSCULAR | Status: AC
  Filled 2019-09-02: qty 1

## 2019-09-02 MED ORDER — FENTANYL CITRATE (PF) 100 MCG/2ML IJ SOLN
INTRAMUSCULAR | Status: AC
  Administered 2019-09-02: 25 ug via INTRAVENOUS
  Filled 2019-09-02: qty 2

## 2019-09-02 MED ORDER — CEFAZOLIN SODIUM-DEXTROSE 2-4 GM/100ML-% IV SOLN
INTRAVENOUS | Status: AC
  Filled 2019-09-02: qty 100

## 2019-09-02 MED ORDER — FENTANYL CITRATE (PF) 100 MCG/2ML IJ SOLN
50.0000 ug | Freq: Once | INTRAMUSCULAR | Status: AC
  Administered 2019-09-02: 50 ug via INTRAVENOUS

## 2019-09-02 MED ORDER — PROPOFOL 10 MG/ML IV BOLUS
INTRAVENOUS | Status: AC
  Filled 2019-09-02: qty 40

## 2019-09-02 MED ORDER — BUPIVACAINE LIPOSOME 1.3 % IJ SUSP
INTRAMUSCULAR | Status: DC | PRN
  Administered 2019-09-02: 20 mL via PERINEURAL

## 2019-09-02 MED ORDER — LIDOCAINE HCL (PF) 1 % IJ SOLN
INTRAMUSCULAR | Status: AC
  Filled 2019-09-02: qty 5

## 2019-09-02 MED ORDER — ONDANSETRON HCL 4 MG/2ML IJ SOLN: 4.0000 mg | Freq: Once | INTRAMUSCULAR | Status: DC | PRN

## 2019-09-02 MED ORDER — LIDOCAINE HCL (PF) 1 % IJ SOLN
INTRAMUSCULAR | Status: DC | PRN
  Administered 2019-09-02: 4 mL via INTRADERMAL

## 2019-09-02 MED ORDER — FENTANYL CITRATE (PF) 100 MCG/2ML IJ SOLN: 25.0000 ug | INTRAMUSCULAR | Status: DC | PRN

## 2019-09-02 MED ORDER — ONDANSETRON 4 MG PO TBDP: 4.0000 mg | ORAL_TABLET | Freq: Three times a day (TID) | ORAL | 0 refills | Status: DC | PRN

## 2019-09-02 MED ORDER — FENTANYL CITRATE (PF) 100 MCG/2ML IJ SOLN
25.0000 ug | Freq: Once | INTRAMUSCULAR | Status: AC
  Administered 2019-09-02: 11:00:00 25 ug via INTRAVENOUS

## 2019-09-02 MED ORDER — FAMOTIDINE 20 MG PO TABS
20.0000 mg | ORAL_TABLET | Freq: Once | ORAL | Status: AC
  Administered 2019-09-02: 20 mg via ORAL

## 2019-09-02 SURGICAL SUPPLY — 82 items
ADAPTER IRRIG TUBE 2 SPIKE SOL (ADAPTER) ×4 IMPLANT
ANCHOR 2.3 SP SGL 1.2 XBRAID (Anchor) ×2 IMPLANT
ANCHOR SUT BIO SW 4.75X19.1 (Anchor) ×2 IMPLANT
ANCHOR SUT FBRTK SUTURETAP 1.3 (Anchor) ×1 IMPLANT
BIT DRILL RIGD1.8MM FBRTK STRL (DRILL) IMPLANT
BLADE OSCILLATING/SAGITTAL (BLADE) ×1
BLADE SW THK.38XMED LNG THN (BLADE) IMPLANT
BUR BR 5.5 12 FLUTE (BURR) ×1 IMPLANT
BUR RADIUS 4.0X18.5 (BURR) IMPLANT
CANNULA PART THRD DISP 5.75X7 (CANNULA) IMPLANT
CANNULA PARTIAL THREAD 2X7 (CANNULA) IMPLANT
CANNULA TWIST IN 8.25X9CM (CANNULA) IMPLANT
CHLORAPREP W/TINT 26 (MISCELLANEOUS) ×2 IMPLANT
COOLER POLAR GLACIER W/PUMP (MISCELLANEOUS) ×2 IMPLANT
COVER LIGHT HANDLE STERIS (MISCELLANEOUS) ×2 IMPLANT
COVER WAND RF STERILE (DRAPES) ×2 IMPLANT
CRADLE LAMINECT ARM (MISCELLANEOUS) ×4 IMPLANT
DERMABOND ADVANCED (GAUZE/BANDAGES/DRESSINGS) ×2
DERMABOND ADVANCED .7 DNX12 (GAUZE/BANDAGES/DRESSINGS) IMPLANT
DRAPE 3/4 80X56 (DRAPES) ×2 IMPLANT
DRAPE INCISE IOBAN 66X45 STRL (DRAPES) ×2 IMPLANT
DRAPE SPLIT 6X30 W/TAPE (DRAPES) ×4 IMPLANT
DRAPE STERI 35X30 U-POUCH (DRAPES) ×2 IMPLANT
DRAPE U-SHAPE 47X51 STRL (DRAPES) ×2 IMPLANT
DRILL RIGID 1.8MM FBRTK STRL (DRILL) ×2
DRSG TEGADERM 4X4.75 (GAUZE/BANDAGES/DRESSINGS) ×1 IMPLANT
ELECT REM PT RETURN 9FT ADLT (ELECTROSURGICAL) ×2
ELECTRODE REM PT RTRN 9FT ADLT (ELECTROSURGICAL) ×1 IMPLANT
GAUZE SPONGE 4X4 12PLY STRL (GAUZE/BANDAGES/DRESSINGS) ×2 IMPLANT
GAUZE XEROFORM 1X8 LF (GAUZE/BANDAGES/DRESSINGS) ×2 IMPLANT
GLOVE BIOGEL PI IND STRL 8 (GLOVE) ×1 IMPLANT
GLOVE BIOGEL PI INDICATOR 8 (GLOVE) ×1
GLOVE SURG SYN 8.0 (GLOVE) ×2 IMPLANT
GLOVE SURG SYN 8.0 PF PI (GLOVE) ×1 IMPLANT
GOWN STRL REUS W/ TWL LRG LVL3 (GOWN DISPOSABLE) ×1 IMPLANT
GOWN STRL REUS W/TWL LRG LVL3 (GOWN DISPOSABLE) ×1
GOWN STRL REUS W/TWL LRG LVL4 (GOWN DISPOSABLE) ×2 IMPLANT
IV LACTATED RINGER IRRG 3000ML (IV SOLUTION) ×4
IV LR IRRIG 3000ML ARTHROMATIC (IV SOLUTION) ×4 IMPLANT
KIT SPEAR STR 1.6MM DRILL (MISCELLANEOUS) ×1 IMPLANT
KIT STABILIZATION SHOULDER (MISCELLANEOUS) ×2 IMPLANT
KIT TURNOVER KIT A (KITS) ×2 IMPLANT
MANIFOLD NEPTUNE II (INSTRUMENTS) ×2 IMPLANT
MASK FACE SPIDER DISP (MASK) ×2 IMPLANT
MAT ABSORB  FLUID 56X50 GRAY (MISCELLANEOUS) ×2
MAT ABSORB FLUID 56X50 GRAY (MISCELLANEOUS) ×2 IMPLANT
NDL MAYO 6 CRC TAPER PT (NEEDLE) IMPLANT
NDL SAFETY ECLIPSE 18X1.5 (NEEDLE) ×1 IMPLANT
NDL SCORPION MULTI FIRE (NEEDLE) ×1 IMPLANT
NEEDLE HYPO 18GX1.5 SHARP (NEEDLE) ×1
NEEDLE HYPO 22GX1.5 SAFETY (NEEDLE) ×2 IMPLANT
NEEDLE MAYO 6 CRC TAPER PT (NEEDLE) IMPLANT
NEEDLE SCORPION MULTI FIRE (NEEDLE) ×4 IMPLANT
PACK ARTHROSCOPY SHOULDER (MISCELLANEOUS) ×2 IMPLANT
PAD ABD DERMACEA PRESS 5X9 (GAUZE/BANDAGES/DRESSINGS) ×2 IMPLANT
PAD WRAPON POLAR SHDR XLG (MISCELLANEOUS) ×1 IMPLANT
PENCIL SMOKE ULTRAEVAC 22 CON (MISCELLANEOUS) ×2 IMPLANT
SET TUBE SUCT SHAVER OUTFL 24K (TUBING) ×2 IMPLANT
SET TUBE TIP INTRA-ARTICULAR (MISCELLANEOUS) ×2 IMPLANT
SLING ULTRA II M (MISCELLANEOUS) ×2 IMPLANT
STAPLER SKIN PROX 35W (STAPLE) IMPLANT
STRAP SAFETY 5IN WIDE (MISCELLANEOUS) ×2 IMPLANT
STRIP CLOSURE SKIN 1/2X4 (GAUZE/BANDAGES/DRESSINGS) IMPLANT
SUT ETHILON 3-0 (SUTURE) ×1 IMPLANT
SUT ETHILON 4-0 (SUTURE)
SUT ETHILON 4-0 FS2 18XMFL BLK (SUTURE)
SUT LASSO 90 DEG SD STR (SUTURE) IMPLANT
SUT PROLENE 0 CT 2 (SUTURE) IMPLANT
SUT TICRON 2-0 30IN 311381 (SUTURE) IMPLANT
SUT VIC AB 0 CT1 36 (SUTURE) ×1 IMPLANT
SUT VIC AB 2-0 CT2 27 (SUTURE) IMPLANT
SUT VICRYL 3-0 27IN (SUTURE) ×1 IMPLANT
SUTURE ETHLN 4-0 FS2 18XMF BLK (SUTURE) ×1 IMPLANT
SUTURE TAPE 1.3 40 TPR END (SUTURE) ×2 IMPLANT
SUTURETAPE 1.3 40 TPR END (SUTURE) ×4
SYR 10ML LL (SYRINGE) ×2 IMPLANT
TAPE CLOTH 3X10 WHT NS LF (GAUZE/BANDAGES/DRESSINGS) ×2 IMPLANT
TAPE MICROFOAM 4IN (TAPE) ×2 IMPLANT
TUBING ARTHRO INFLOW-ONLY STRL (TUBING) ×2 IMPLANT
TUBING CONNECTING 10 (TUBING) ×2 IMPLANT
WAND WEREWOLF FLOW 90D (MISCELLANEOUS) ×2 IMPLANT
WRAPON POLAR PAD SHDR XLG (MISCELLANEOUS) ×2

## 2019-09-02 NOTE — Anesthesia Post-op Follow-up Note (Signed)
Anesthesia QCDR form completed.        

## 2019-09-02 NOTE — Discharge Instructions (Signed)
Post-Op Instructions - Rotator Cuff Repair ° °1. Bracing: You will wear a shoulder immobilizer or sling for 6 weeks.  ° °2. Driving: No driving for 3 weeks post-op. When driving, do not wear the immobilizer. Ideally, we recommend no driving for 6 weeks while sling is in place as one arm will be immobilized.  ° °3. Activity: No active lifting for 2 months. Wrist, hand, and elbow motion only. Avoid lifting the upper arm away from the body except for hygiene. You are permitted to bend and straighten the elbow passively only (no active elbow motion). You may use your hand and wrist for typing, writing, and managing utensils (cutting food). Do not lift more than a coffee cup for 8 weeks.  When sleeping or resting, inclined positions (recliner chair or wedge pillow) and a pillow under the forearm for support may provide better comfort for up to 4 weeks.  Avoid long distance travel for 4 weeks. ° °Return to normal activities after rotator cuff repair repair normally takes 6 months on average. If rehab goes very well, may be able to do most activities at 4 months, except overhead or contact sports. ° °4. Physical Therapy: Begins 3-4 days after surgery, and proceed 1 time per week for the first 6 weeks, then 1-2 times per week from weeks 6-20 post-op. ° °5. Medications:  °- You will be provided a prescription for narcotic pain medicine. After surgery, take 1-2 narcotic tablets every 4 hours if needed for severe pain.  °- A prescription for anti-nausea medication will be provided in case the narcotic medicine causes nausea - take 1 tablet every 6 hours only if nauseated.   °- Take tylenol 1000 mg (2 Extra Strength tablets or 3 regular strength) every 8 hours for pain.  May decrease or stop tylenol 5 days after surgery if you are having minimal pain. °- Take ASA 325mg/day x 2 weeks to help prevent DVTs/PEs (blood clots).  °- DO NOT take ANY nonsteroidal anti-inflammatory pain medications (Advil, Motrin, Ibuprofen, Aleve,  Naproxen, or Naprosyn). These medicines can inhibit healing of your shoulder repair.  ° ° °If you are taking prescription medication for anxiety, depression, insomnia, muscle spasm, chronic pain, or for attention deficit disorder, you are advised that you are at a higher risk of adverse effects with use of narcotics post-op, including narcotic addiction/dependence, depressed breathing, death. °If you use non-prescribed substances: alcohol, marijuana, cocaine, heroin, methamphetamines, etc., you are at a higher risk of adverse effects with use of narcotics post-op, including narcotic addiction/dependence, depressed breathing, death. °You are advised that taking > 50 morphine milligram equivalents (MME) of narcotic pain medication per day results in twice the risk of overdose or death. For your prescription provided: oxycodone 5 mg - taking more than 6 tablets per day would result in > 50 morphine milligram equivalents (MME) of narcotic pain medication. °Be advised that we will prescribe narcotics short-term, for acute post-operative pain only - 3 weeks for major operations such as shoulder repair/reconstruction surgeries.  ° ° ° °6. Post-Op Appointment: ° °Your first post-op appointment will be 10-14 days post-op. ° °7. Work or School: For most, but not all procedures, we advise staying out of work or school for at least 1 to 2 weeks in order to recover from the stress of surgery and to allow time for healing.  ° °If you need a work or school note this can be provided.  ° °8. Smoking: If you are a smoker, you need to refrain from   smoking in the postoperative period. The nicotine in cigarettes will inhibit healing of your shoulder repair and decrease the chance of successful repair. Similarly, nicotine containing products (gum, patches) should be avoided.  ° °Post-operative Brace: °Apply and remove the brace you received as you were instructed to at the time of fitting and as described in detail as the brace’s  instructions for use indicate.  Wear the brace for the period of time prescribed by your physician.  The brace can be cleaned with soap and water and allowed to air dry only.  Should the brace result in increased pain, decreased feeling (numbness/tingling), increased swelling or an overall worsening of your medical condition, please contact your doctor immediately.  If an emergency situation occurs as a result of wearing the brace after normal business hours, please dial 911 and seek immediate medical attention.  Let your doctor know if you have any further questions about the brace issued to you. °Refer to the shoulder sling instructions for use if you have any questions regarding the correct fit of your shoulder sling.  °[facility] [employer] for Troubleshooting: [phone] ° °Video that illustrates how to properly use a shoulder sling: °"Instructions for Proper Use of an Orthopaedic Sling" °https://www.youtube.com/watch?v=AHZpn_Xo45w ° ° °AMBULATORY SURGERY  °DISCHARGE INSTRUCTIONS ° ° °1) The drugs that you were given will stay in your system until tomorrow so for the next 24 hours you should not: ° °A) Drive an automobile °B) Make any legal decisions °C) Drink any alcoholic beverage ° ° °2) You may resume regular meals tomorrow.  Today it is better to start with liquids and gradually work up to solid foods. ° °You may eat anything you prefer, but it is better to start with liquids, then soup and crackers, and gradually work up to solid foods. ° ° °3) Please notify your doctor immediately if you have any unusual bleeding, trouble breathing, redness and pain at the surgery site, drainage, fever, or pain not relieved by medication. ° ° ° °4) Additional Instructions: ° ° ° ° ° ° ° °Please contact your physician with any problems or Same Day Surgery at [phone], Monday through Friday 6 am to 4 pm, or [facility] Main number at [phone]. ° ° °

## 2019-09-02 NOTE — Transfer of Care (Signed)
Immediate Anesthesia Transfer of Care Note  Patient: Bruce Diaz  Procedure(s) Performed: SHOULDER ARTHROSCOPY WITH OPEN ROTATOR CUFF REPAIR, BICEPS TENODESIS, SUBACROMIAL DECOMPRESSION (Left )  Patient Location: PACU  Anesthesia Type:General  Level of Consciousness: sedated  Airway & Oxygen Therapy: Patient Spontanous Breathing and Patient connected to nasal cannula oxygen  Post-op Assessment: Report given to RN and Post -op Vital signs reviewed and stable  Post vital signs: Reviewed and stable  Last Vitals:  Vitals Value Taken Time  BP    Temp    Pulse 98 09/02/19 1524  Resp 19 09/02/19 1524  SpO2 100 % 09/02/19 1524  Vitals shown include unvalidated device data.  Last Pain:  Vitals:   09/02/19 1200  TempSrc:   PainSc: 0-No pain         Complications: No apparent anesthesia complications

## 2019-09-02 NOTE — H&P (Signed)
Paper H&P to be scanned into permanent record. H&P reviewed. No significant changes noted.  

## 2019-09-02 NOTE — Anesthesia Preprocedure Evaluation (Signed)
Anesthesia Evaluation  Patient identified by MRN, date of birth, ID band Patient awake    Reviewed: Allergy & Precautions, H&P , NPO status , Patient's Chart, lab work & pertinent test results, reviewed documented beta blocker date and time   Airway Mallampati: II  TM Distance: >3 FB Neck ROM: full    Dental  (+) Teeth Intact   Pulmonary neg pulmonary ROS,    Pulmonary exam normal        Cardiovascular Exercise Tolerance: Good negative cardio ROS Normal cardiovascular exam Rhythm:regular Rate:Normal     Neuro/Psych negative neurological ROS  negative psych ROS   GI/Hepatic negative GI ROS, Neg liver ROS, GERD  Medicated,  Endo/Other  negative endocrine ROS  Renal/GU negative Renal ROS  negative genitourinary   Musculoskeletal   Abdominal   Peds  Hematology negative hematology ROS (+)   Anesthesia Other Findings Past Medical History: No date: GERD (gastroesophageal reflux disease) No date: Medical history non-contributory Past Surgical History: No date: COLONOSCOPY 05/09/2019: SHOULDER ARTHROSCOPY WITH OPEN ROTATOR CUFF REPAIR; Right     Comment:  Procedure: SHOULDER ARTHROSCOPY WITH MINI  OPEN ROTATOR               CUFF REPAIR, BICEPS TENDOSIS DISTAL CLAVICLE EXCISION               SUBSCROMIAL DECOMPRESSION;  Surgeon: Leim Fabry, MD;                Location: [facility];  Service: Orthopedics;                Laterality: Right;  ARTHREX BICEPS 1.9MM FIBERTAK,                STRYKER SPEED SUTURE ANCHOR, COBRA SUTURE PASSES OTHER              ARTHROCARE WAND, BARREL BURR, OPEN SHID KOLBALT HOMANE               COBBS, ETC. BMI    Body Mass Index: 29.60 kg/m     Reproductive/Obstetrics negative OB ROS                             Anesthesia Physical Anesthesia Plan  ASA: II  Anesthesia Plan: General ETT   Post-op Pain Management:  Regional for Post-op pain    Induction:   PONV Risk Score and Plan: 3  Airway Management Planned:   Additional Equipment:   Intra-op Plan:   Post-operative Plan:   Informed Consent: I have reviewed the patients History and Physical, chart, labs and discussed the procedure including the risks, benefits and alternatives for the proposed anesthesia with the patient or authorized representative who has indicated his/her understanding and acceptance.     Dental Advisory Given  Plan Discussed with: CRNA  Anesthesia Plan Comments:         Anesthesia Quick Evaluation

## 2019-09-02 NOTE — Anesthesia Procedure Notes (Signed)
Anesthesia Regional Block: Interscalene brachial plexus block   Pre-Anesthetic Checklist: ,, timeout performed, Correct Patient, Correct Site, Correct Laterality, Correct Procedure, Correct Position, site marked, Risks and benefits discussed,  Surgical consent,  Pre-op evaluation,  At surgeon's request and post-op pain management  Laterality: Left  Prep: chloraprep, alcohol swabs       Needles:  Injection technique: Single-shot  Needle Type: Echogenic Stimulator Needle     Needle Length: 10cm  Needle Gauge: 20     Additional Needles:   Procedures:, nerve stimulator,,, ultrasound used (permanent image in chart),,,,   Nerve Stimulator or Paresthesia:  Response: biceps flexion,   Additional Responses:   Narrative:  Start time: 09/02/2019 11:59 AM Injection made incrementally with aspirations every 5 mL.  Performed by: Personally  Anesthesiologist: Molli Barrows, MD  Additional Notes: Pt was extremely anxious but chose to proceed with the injection.  A Functioning IV was confirmed and monitors were applied. Sterile prep and drape,hand hygiene and sterile gloves were used.  Negative aspiration and negative test dose prior to incremental administration of local anesthetic. The patient tolerated the procedure well and no pain on injection.  JA

## 2019-09-02 NOTE — Anesthesia Procedure Notes (Signed)
Procedure Name: Intubation Date/Time: 09/02/2019 12:20 PM Performed by: Leander Rams, CRNA Pre-anesthesia Checklist: Patient identified, Emergency Drugs available, Suction available, Patient being monitored and Timeout performed Patient Re-evaluated:Patient Re-evaluated prior to induction Oxygen Delivery Method: Circle system utilized Preoxygenation: Pre-oxygenation with 100% oxygen Induction Type: IV induction Ventilation: Mask ventilation with difficulty and Oral airway inserted - appropriate to patient size Laryngoscope Size: McGraph and 4 Grade View: Grade I Tube type: Oral Tube size: 7.5 mm Number of attempts: 1 Airway Equipment and Method: Stylet Tube secured with: Tape Dental Injury: Teeth and Oropharynx as per pre-operative assessment

## 2019-09-02 NOTE — Op Note (Signed)
SURGERY DATE: 09/02/2019  PRE-OP DIAGNOSIS:  1. Left subacromial impingement 2. Left biceps tendinopathy 3. Left rotator cuff tear  POST-OP DIAGNOSIS: 1. Left subacromial impingement 2. Left biceps tendinopathy 3. Left rotator cuff tear  PROCEDURES:  1. Left mini-open rotator cuff repair 2. Left open biceps tenodesis 3. Left arthroscopic extensive debridement of shoulder (glenohumeral and subacromial spaces) 4. Left arthroscopic subacromial decompression  SURGEON: Rosealee Albee, MD  ANESTHESIA: Gen with Exparil interscalene block  ESTIMATED BLOOD LOSS: 25cc  DRAINS:  none  TOTAL IV FLUIDS: per anesthesia   SPECIMENS: none  IMPLANTS:  - Arthrex 4.63mm SwiveLock x2 - Arthrex 1.42mm FiberTak - Iconix SPEED double loaded with 1.2 and 2.108mm tape x 2   OPERATIVE FINDINGS:  Examination under anesthesia: A careful examination under anesthesia was performed.  Passive range of motion was: FF: 150; ER at side: 45; ER in abduction: 90; IR in abduction: 50.  Anterior load shift: NT.  Posterior load shift: NT.  Sulcus in neutral: NT.  Sulcus in ER: NT.    Intra-operative findings: A thorough arthroscopic examination of the shoulder was performed.  The findings are: 1. Biceps tendon: tendinopathy about proximal insertion on superior labrum 2. Superior labrum: normal 3. Posterior labrum and capsule: normal 4. Inferior capsule and inferior recess: normal 5. Glenoid cartilage surface: Normal 6. Supraspinatus attachment: 90% tear of bursal surface, articular sided fibers at the footprint only intact (~41mm of intact fibers) with complete delamination and retraction of bursal side with exposed footprint 7. Posterior rotator cuff attachment: normal 8. Humeral head articular cartilage: normal 9. Rotator interval: significant synovitis 10: Subscapularis tendon: attachment intact 11. Anterior labrum: degenerative 12. IGHL: normal  OPERATIVE REPORT:   Indications for procedure: Bruce Diaz is a 53 y.o. male with ~1.5 years of L shoulder pain that has failed non-operative management including activity modification, physical therapy, medical management and corticosteroid injection without adequate relief of symptoms. Clinical exam and MRI were suggestive of rotator cuff tear, biceps tendinopathy, and subacromial impingement. After discussion of risks, benefits, and alternatives to surgery, the patient elected to proceed. Of note, I performed rotator cuff repair on his contralateral right shoulder on 05/09/19, and he is doing well post-operatively from that surgery.  Procedure in detail:  I identified Bruce Diaz in the pre-operative holding area.  I marked the operative shoulder with my initials. I reviewed the risks and benefits of the proposed surgical intervention, and the patient (and/or patient's guardian) wished to proceed.  Anesthesia was then performed with an Exparil interscalene block.  The patient was transferred to the operative suite and placed in the beach chair position.    SCDs were placed on the lower extremities. Appropriate IV antibiotics were administered prior to incision. The operative upper extremity was then prepped and draped in standard fashion. A time out was performed confirming the correct extremity, correct patient, and correct procedure.   I then created a standard posterior portal with an 11 blade. The glenohumeral joint was easily entered with a blunt trochar and the arthroscope introduced. The findings of diagnostic arthroscopy are described above. I debrided degenerative tissue including the synovitic tissue about the rotator interval and anterior labrum. I then coagulated the inflamed synovium to obtain hemostasis and reduce the risk of post-operative swelling using an Arthrocare radiofrequency device. I performed a biceps tenotomy using an arthroscopic scissors and used a motorized shaver to debride the stump back to a stable base.   Next, the  arthroscope was then introduced  into the subacromial space. A direct lateral portal was created with an 11-blade after spinal needle localization. An extensive subacromial bursectomy was performed using a combination of the shaver and Arthrocare wand. The entire acromial undersurface was exposed and the CA ligament was subperiosteally elevated to expose the anterior acromial hook. A 5.64mm barrel burr was used to create a flat anterior and lateral aspect of the acromion, converting it from a Type 2 to a Type 1 acromion. Care was made to keep the deltoid fascia intact.  A longitudinal incision from the anterolateral acromion ~6cm in length was made overlying the raphe between the anterior and middle heads of the deltoid. The raphe was identified and it was incised. The subacromial space was identified. Any remaining bursa was excised. The rotator cuff tear was identified. It was an U-shaped tear as described above.    We then turned our attention to the biceps tenodesis. The arm was externally rotated.  The bicipital groove was identified.  A 15 blade was used to make a cut overlying the biceps tendon, and the tendon was removed using a right angle clamp.  The base of the bicipital groove was identified and cleared of soft tissue.  A FiberTak anchor was placed in the bicipital groove.  The biceps tendon was held at the appropriate amount of tension.  One set of sutures was passed through the biceps anchor with one limb passed in a simple fashion and the second limb passed in a simple plus locking stitch pattern.  This was repeated for the other set of sutures.  This construct allowed for shuttling the biceps tendon down to the bone.  The sutures were tied and cut.  The diseased portion of the proximal biceps was then excised.  The arm was then internally rotated.  The remaining few fibers of the rotator cuff were cut with a 15 blade to make it a complete, full-thickness tear. The rotator cuff footprint was  cleared of soft tissue. A rongeur was used to gently decorticate the rotator cuff footprint to allow for improved healing. Two Iconix SPEED anchors were placed just lateral to the articular margin.  The rotator cuff was mobilized using key elevators both superior and inferior to the tear. The rotator cuff was able to be reduced to its footprint and then held in a reduced position with graspers. All 8 strands of suture from the medial row anchors were passed through the rotator cuff in an appropriate fashion. Two SwiveLock anchors were placed for the lateral row anchors with one limb of each of the remaining two medial row sutures passed through each anchor.  This allowed for reapproximation of the rotator cuff over its footprint. The posterior anchor #2 FiberWire from the SwiveLock anchor was additionally passed through a dog ear in the posterior aspect of the rotator cuff to further reapproximate the tear. The anterior anchor #2 FiberWire was placed similarly in the anterior rotator cuff.  This construct was stable with external and internal rotation and allowed for appropriate reapproximation of the rotator cuff to its natural footprint with excellent compression.  The wound was thoroughly irrigated.  The deltoid split was closed with 0 Vicryl.  The subdermal layer was closed with 2-0 Vicryl.  The skin was closed with 4-0 Monocryl and Dermabond. The portals were closed with 3-0 Nylon. Xeroform was applied to the incisions. A sterile dressing was applied, followed by a Polar Care sleeve and a SlingShot shoulder immobilizer/sling. The patient was awakened from anesthesia without difficulty  and was transferred to the PACU in stable condition.     COMPLICATIONS: none  DISPOSITION: plan for discharge home after recovery in PACU   POSTOPERATIVE PLAN: Remain in sling (except hygiene and elbow/wrist/hand RoM exercises as instructed by PT) x 6 weeks and NWB for this time. PT to begin 3-4 days after surgery.  Rotator cuff repair and biceps tenodesis rehab protocol. ASA 325mg  daily x 2 weeks for DVT ppx.

## 2019-09-03 ENCOUNTER — Encounter: Payer: Self-pay | Admitting: Orthopedic Surgery

## 2019-09-03 NOTE — Anesthesia Postprocedure Evaluation (Signed)
Anesthesia Post Note  Patient: Bruce Diaz  Procedure(s) Performed: SHOULDER ARTHROSCOPY WITH OPEN ROTATOR CUFF REPAIR, BICEPS TENODESIS, SUBACROMIAL DECOMPRESSION (Left )  Patient location during evaluation: PACU Anesthesia Type: General Level of consciousness: awake and alert Pain management: pain level controlled Vital Signs Assessment: post-procedure vital signs reviewed and stable Respiratory status: spontaneous breathing, nonlabored ventilation, respiratory function stable and patient connected to nasal cannula oxygen Cardiovascular status: blood pressure returned to baseline and stable Postop Assessment: no apparent nausea or vomiting Anesthetic complications: no     Last Vitals:  Vitals:   09/02/19 1615 09/02/19 1632  BP: 137/86 (!) 146/95  Pulse: 97 95  Resp: 20 18  Temp: (!) 36.1 C 36.4 C  SpO2: 99% 98%    Last Pain:  Vitals:   09/03/19 0807  TempSrc:   PainSc: 0-No pain                 Gaynel Schaafsma S

## 2019-09-05 DIAGNOSIS — M75102 Unspecified rotator cuff tear or rupture of left shoulder, not specified as traumatic: Secondary | ICD-10-CM | POA: Diagnosis not present

## 2019-09-12 DIAGNOSIS — M25512 Pain in left shoulder: Secondary | ICD-10-CM | POA: Diagnosis not present

## 2019-09-18 DIAGNOSIS — Z9889 Other specified postprocedural states: Secondary | ICD-10-CM | POA: Diagnosis not present

## 2019-09-18 DIAGNOSIS — G8929 Other chronic pain: Secondary | ICD-10-CM | POA: Diagnosis not present

## 2019-09-18 DIAGNOSIS — M25512 Pain in left shoulder: Secondary | ICD-10-CM | POA: Diagnosis not present

## 2019-09-25 DIAGNOSIS — G8929 Other chronic pain: Secondary | ICD-10-CM | POA: Diagnosis not present

## 2019-09-25 DIAGNOSIS — M25512 Pain in left shoulder: Secondary | ICD-10-CM | POA: Diagnosis not present

## 2019-10-01 DIAGNOSIS — G8929 Other chronic pain: Secondary | ICD-10-CM | POA: Diagnosis not present

## 2019-10-01 DIAGNOSIS — M6281 Muscle weakness (generalized): Secondary | ICD-10-CM | POA: Diagnosis not present

## 2019-10-01 DIAGNOSIS — M25612 Stiffness of left shoulder, not elsewhere classified: Secondary | ICD-10-CM | POA: Diagnosis not present

## 2019-10-01 DIAGNOSIS — M25512 Pain in left shoulder: Secondary | ICD-10-CM | POA: Diagnosis not present

## 2019-10-09 DIAGNOSIS — M6281 Muscle weakness (generalized): Secondary | ICD-10-CM | POA: Diagnosis not present

## 2019-10-09 DIAGNOSIS — M25512 Pain in left shoulder: Secondary | ICD-10-CM | POA: Diagnosis not present

## 2019-10-09 DIAGNOSIS — G8929 Other chronic pain: Secondary | ICD-10-CM | POA: Diagnosis not present

## 2019-10-09 DIAGNOSIS — M25612 Stiffness of left shoulder, not elsewhere classified: Secondary | ICD-10-CM | POA: Diagnosis not present

## 2019-10-16 DIAGNOSIS — M6281 Muscle weakness (generalized): Secondary | ICD-10-CM | POA: Diagnosis not present

## 2019-10-16 DIAGNOSIS — M75102 Unspecified rotator cuff tear or rupture of left shoulder, not specified as traumatic: Secondary | ICD-10-CM | POA: Diagnosis not present

## 2019-10-16 DIAGNOSIS — M25512 Pain in left shoulder: Secondary | ICD-10-CM | POA: Diagnosis not present

## 2019-10-16 DIAGNOSIS — M25612 Stiffness of left shoulder, not elsewhere classified: Secondary | ICD-10-CM | POA: Diagnosis not present

## 2019-10-22 DIAGNOSIS — Z6831 Body mass index (BMI) 31.0-31.9, adult: Secondary | ICD-10-CM | POA: Diagnosis not present

## 2019-10-22 DIAGNOSIS — Z1331 Encounter for screening for depression: Secondary | ICD-10-CM | POA: Diagnosis not present

## 2019-10-22 DIAGNOSIS — Z0000 Encounter for general adult medical examination without abnormal findings: Secondary | ICD-10-CM | POA: Diagnosis not present

## 2019-10-22 DIAGNOSIS — Z13220 Encounter for screening for lipoid disorders: Secondary | ICD-10-CM | POA: Diagnosis not present

## 2019-10-22 LAB — BASIC METABOLIC PANEL
BUN: 14 (ref 4–21)
CO2: 22 (ref 13–22)
Chloride: 104 (ref 99–108)
Creatinine: 1.1 (ref 0.6–1.3)
Glucose: 87
Potassium: 5 mEq/L (ref 3.5–5.1)
Sodium: 140 (ref 137–147)

## 2019-10-22 LAB — COMPREHENSIVE METABOLIC PANEL
Albumin: 4.4 (ref 3.5–5.0)
Calcium: 9.4 (ref 8.7–10.7)
Globulin: 2.9

## 2019-10-22 LAB — HEPATIC FUNCTION PANEL: Bilirubin, Total: 0.3

## 2019-10-23 DIAGNOSIS — G8929 Other chronic pain: Secondary | ICD-10-CM | POA: Diagnosis not present

## 2019-10-23 DIAGNOSIS — M25512 Pain in left shoulder: Secondary | ICD-10-CM | POA: Diagnosis not present

## 2019-10-23 DIAGNOSIS — M75121 Complete rotator cuff tear or rupture of right shoulder, not specified as traumatic: Secondary | ICD-10-CM | POA: Diagnosis not present

## 2019-10-23 DIAGNOSIS — M75122 Complete rotator cuff tear or rupture of left shoulder, not specified as traumatic: Secondary | ICD-10-CM | POA: Diagnosis not present

## 2019-10-23 DIAGNOSIS — M25612 Stiffness of left shoulder, not elsewhere classified: Secondary | ICD-10-CM | POA: Diagnosis not present

## 2019-10-23 DIAGNOSIS — Z9889 Other specified postprocedural states: Secondary | ICD-10-CM | POA: Diagnosis not present

## 2019-10-23 DIAGNOSIS — M6281 Muscle weakness (generalized): Secondary | ICD-10-CM | POA: Diagnosis not present

## 2019-10-30 DIAGNOSIS — M6281 Muscle weakness (generalized): Secondary | ICD-10-CM | POA: Diagnosis not present

## 2019-10-30 DIAGNOSIS — M25512 Pain in left shoulder: Secondary | ICD-10-CM | POA: Diagnosis not present

## 2019-10-30 DIAGNOSIS — M25612 Stiffness of left shoulder, not elsewhere classified: Secondary | ICD-10-CM | POA: Diagnosis not present

## 2019-10-30 DIAGNOSIS — M75102 Unspecified rotator cuff tear or rupture of left shoulder, not specified as traumatic: Secondary | ICD-10-CM | POA: Diagnosis not present

## 2019-11-05 DIAGNOSIS — M25512 Pain in left shoulder: Secondary | ICD-10-CM | POA: Diagnosis not present

## 2019-11-05 DIAGNOSIS — M25612 Stiffness of left shoulder, not elsewhere classified: Secondary | ICD-10-CM | POA: Diagnosis not present

## 2019-11-05 DIAGNOSIS — M75102 Unspecified rotator cuff tear or rupture of left shoulder, not specified as traumatic: Secondary | ICD-10-CM | POA: Diagnosis not present

## 2019-11-05 DIAGNOSIS — M6281 Muscle weakness (generalized): Secondary | ICD-10-CM | POA: Diagnosis not present

## 2019-11-14 DIAGNOSIS — M6281 Muscle weakness (generalized): Secondary | ICD-10-CM | POA: Diagnosis not present

## 2019-11-14 DIAGNOSIS — M75102 Unspecified rotator cuff tear or rupture of left shoulder, not specified as traumatic: Secondary | ICD-10-CM | POA: Diagnosis not present

## 2019-11-14 DIAGNOSIS — M25612 Stiffness of left shoulder, not elsewhere classified: Secondary | ICD-10-CM | POA: Diagnosis not present

## 2019-11-14 DIAGNOSIS — M25512 Pain in left shoulder: Secondary | ICD-10-CM | POA: Diagnosis not present

## 2019-11-20 DIAGNOSIS — G8929 Other chronic pain: Secondary | ICD-10-CM | POA: Diagnosis not present

## 2019-11-20 DIAGNOSIS — M25512 Pain in left shoulder: Secondary | ICD-10-CM | POA: Diagnosis not present

## 2019-11-27 DIAGNOSIS — M25512 Pain in left shoulder: Secondary | ICD-10-CM | POA: Diagnosis not present

## 2019-11-27 DIAGNOSIS — M25612 Stiffness of left shoulder, not elsewhere classified: Secondary | ICD-10-CM | POA: Diagnosis not present

## 2019-11-27 DIAGNOSIS — M6281 Muscle weakness (generalized): Secondary | ICD-10-CM | POA: Diagnosis not present

## 2019-11-27 DIAGNOSIS — M75102 Unspecified rotator cuff tear or rupture of left shoulder, not specified as traumatic: Secondary | ICD-10-CM | POA: Diagnosis not present

## 2019-12-04 DIAGNOSIS — M6281 Muscle weakness (generalized): Secondary | ICD-10-CM | POA: Diagnosis not present

## 2019-12-04 DIAGNOSIS — M25612 Stiffness of left shoulder, not elsewhere classified: Secondary | ICD-10-CM | POA: Diagnosis not present

## 2019-12-04 DIAGNOSIS — M75102 Unspecified rotator cuff tear or rupture of left shoulder, not specified as traumatic: Secondary | ICD-10-CM | POA: Diagnosis not present

## 2019-12-04 DIAGNOSIS — M25512 Pain in left shoulder: Secondary | ICD-10-CM | POA: Diagnosis not present

## 2019-12-11 DIAGNOSIS — M25612 Stiffness of left shoulder, not elsewhere classified: Secondary | ICD-10-CM | POA: Diagnosis not present

## 2019-12-11 DIAGNOSIS — M25512 Pain in left shoulder: Secondary | ICD-10-CM | POA: Diagnosis not present

## 2019-12-11 DIAGNOSIS — Z9889 Other specified postprocedural states: Secondary | ICD-10-CM | POA: Diagnosis not present

## 2019-12-11 DIAGNOSIS — G8929 Other chronic pain: Secondary | ICD-10-CM | POA: Diagnosis not present

## 2019-12-11 DIAGNOSIS — M6281 Muscle weakness (generalized): Secondary | ICD-10-CM | POA: Diagnosis not present

## 2019-12-29 IMAGING — MR MRI OF THE LEFT SHOULDER WITHOUT CONTRAST
5 series · 40 of 40 positions shown · non-contrast
Comparison: None.

CLINICAL DATA: The patient felt a pop in his left shoulder when he
was throwing bags of rock off the bed of a truck in the [REDACTED] of
6060. Pain and limited range of motion since the incident. Initial
encounter.

EXAM:
MRI OF THE LEFT SHOULDER WITHOUT CONTRAST
TECHNIQUE: Multiplanar, multisequence MR imaging of the shoulder was performed.
No intravenous contrast was administered.

[Series 2: PD fat-sat · axial · left · 4.0mm · 0.55mm/px · z∈[-48,+82]mm · 10 of 28 slices shown (1 of 2)]
[im 1/28]
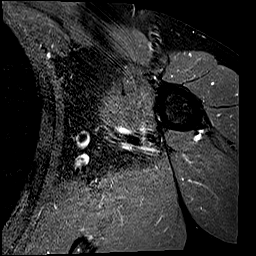
[im 4/28]
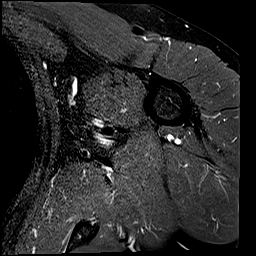
[im 7/28]
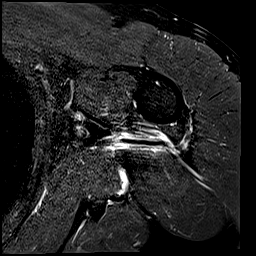
[im 10/28]
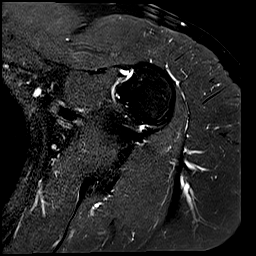
[im 13/28]
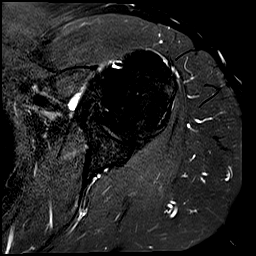
[im 16/28]
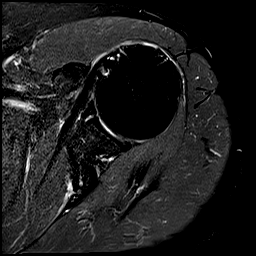
[im 19/28]
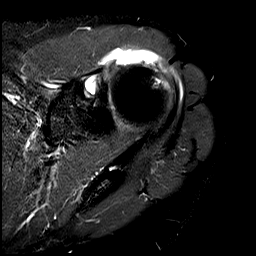
[im 22/28]
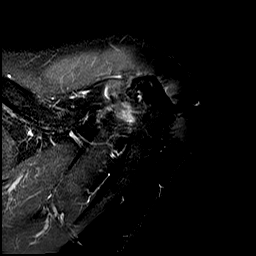
[im 25/28]
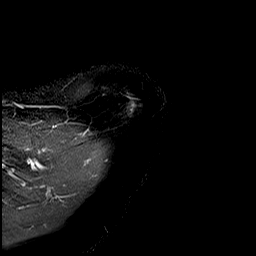
[im 28/28]
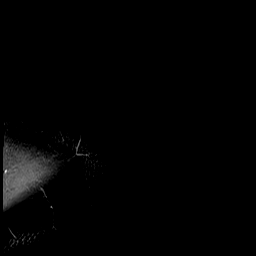

[Series 3: PD fat-sat · oblique · left · 4.0mm · 0.44mm/px · 8 of 26 slices shown (2 of 2)]
[im 1/26]
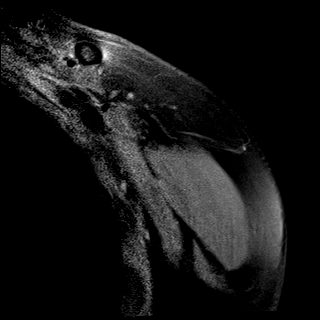
[im 4/26]
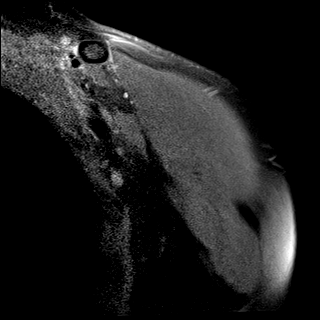
[im 8/26]
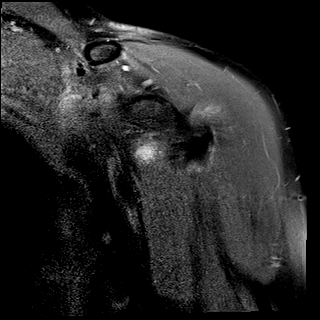
[im 11/26]
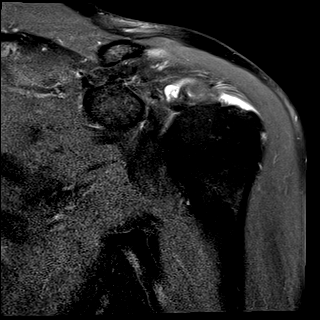
[im 15/26]
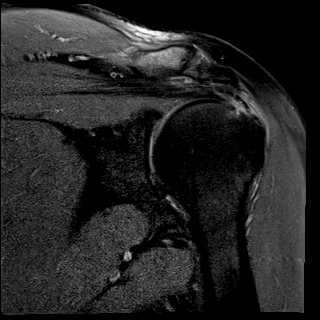
[im 18/26]
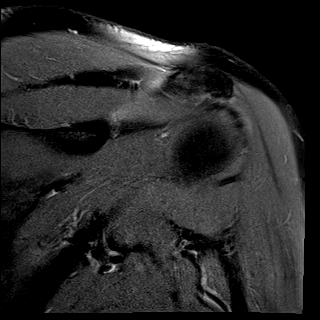
[im 22/26]
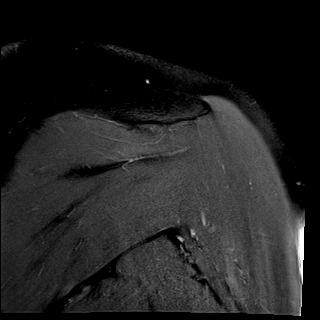
[im 26/26]
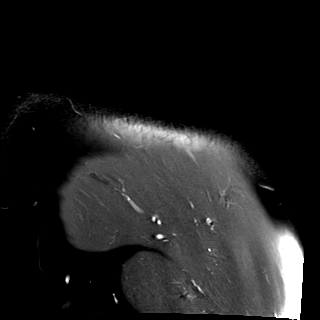

[Series 4: T2 fat-sat · oblique · left · 4.0mm · 0.55mm/px · 8 of 26 slices shown (1 of 2)]
[im 1/26]
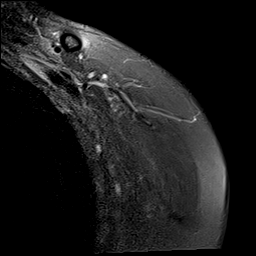
[im 4/26]
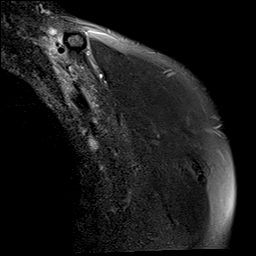
[im 8/26]
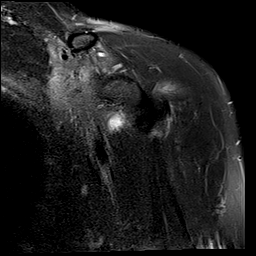
[im 11/26]
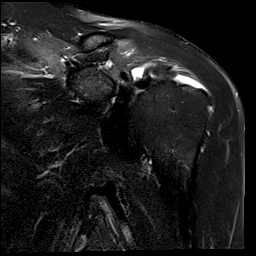
[im 15/26]
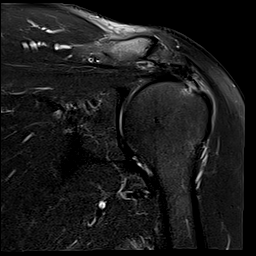
[im 18/26]
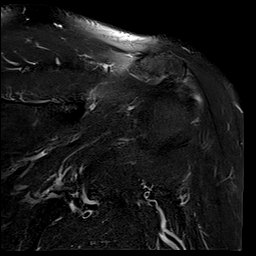
[im 22/26]
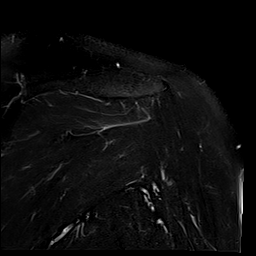
[im 26/26]
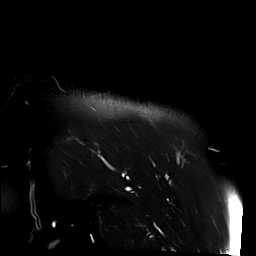

[Series 5: T2 fat-sat · oblique · left · 4.0mm · 0.27mm/px · 7 of 22 slices shown (2 of 2)]
[im 1/22]
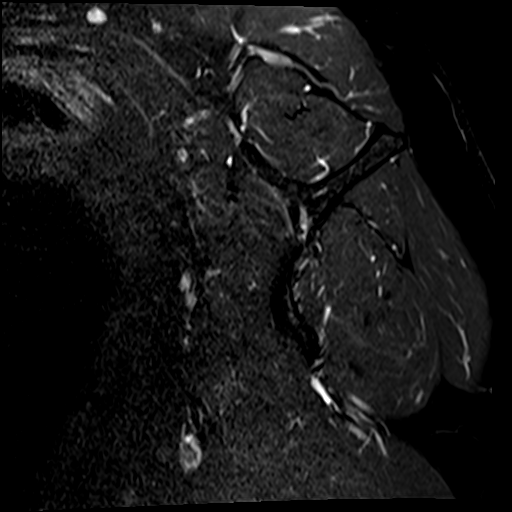
[im 4/22]
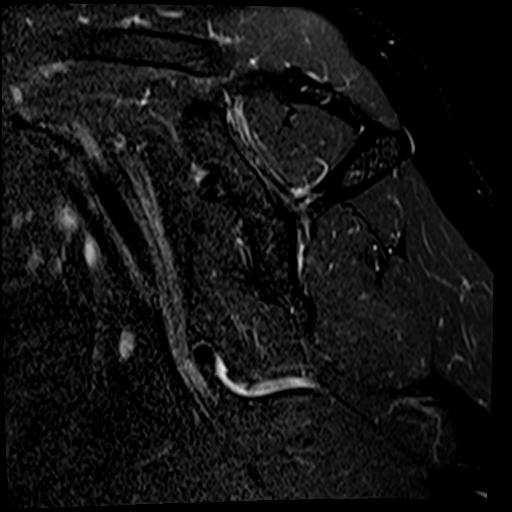
[im 8/22]
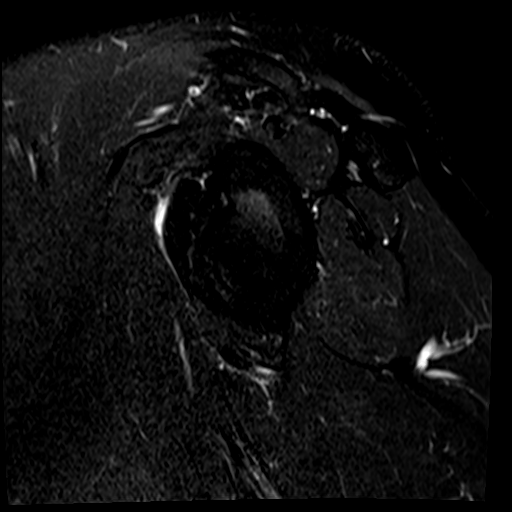
[im 11/22]
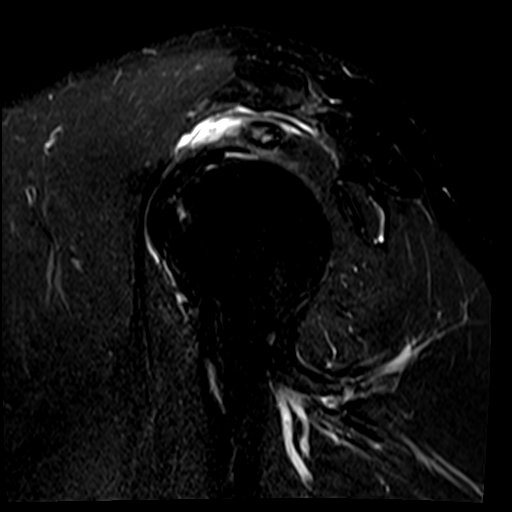
[im 15/22]
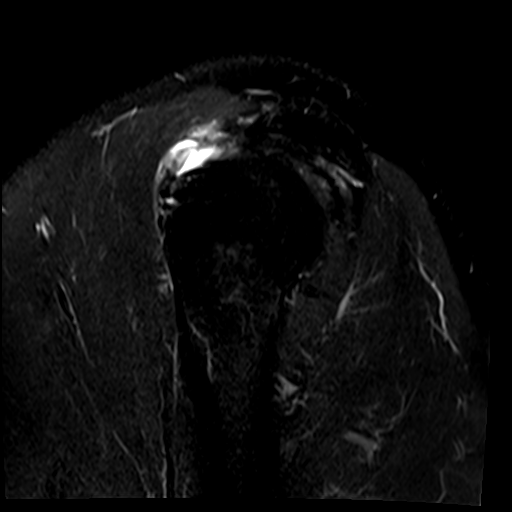
[im 18/22]
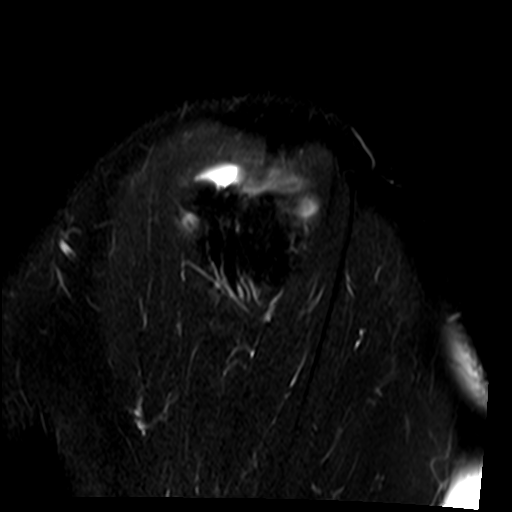
[im 22/22]
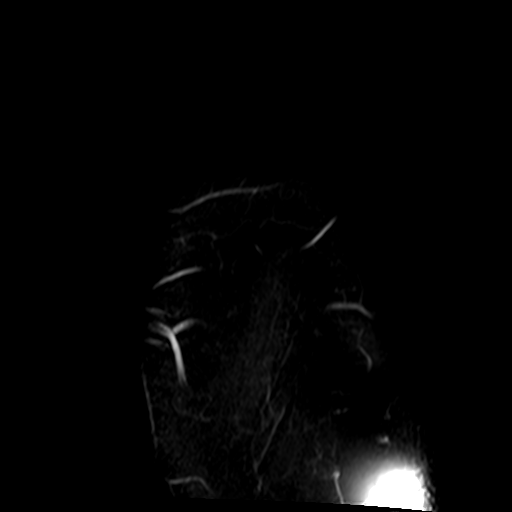

[Series 6: T1 · oblique · left · 4.0mm · 0.55mm/px · 7 of 22 slices shown]
[im 1/22]
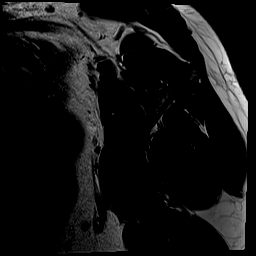
[im 4/22]
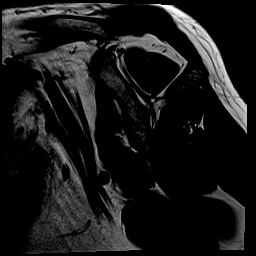
[im 8/22]
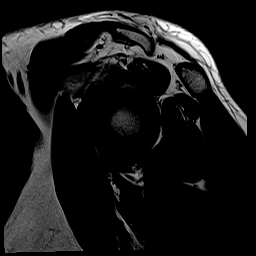
[im 11/22]
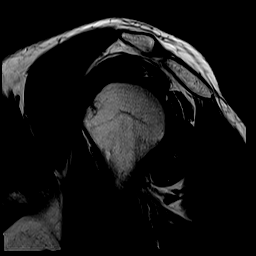
[im 15/22]
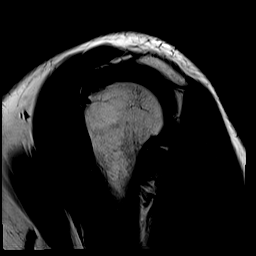
[im 18/22]
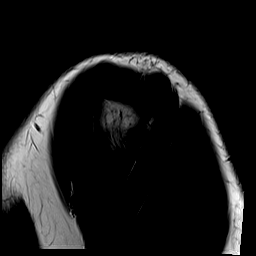
[im 22/22]
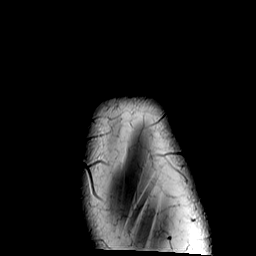

[40 of 40 positions shown; findings below may reference images not displayed]

FINDINGS: Rotator cuff: Large full-thickness tear of the anterior and far
lateral supraspinatus measures 1.6 cm from front to back. Retraction
is at and just medial to the top of the humeral head, 2-3 cm. The
supraspinatus is markedly thickened. Milder degree of tendinopathy
in the infraspinatus is identified. Subscapularis is unremarkable.

Muscles:  Intact and normal in appearance.

Biceps long head:  Intact and normal in appearance.

Acromioclavicular Joint: Mild to moderate degenerative change is
present. Type 1 acromion. Subacromial spurring. Small volume of
fluid in the subacromial/subdeltoid bursa is noted.

Glenohumeral Joint: Appears normal.

Labrum:  Intact.

Bones:  No fracture or focal lesion.

Other: None.
IMPRESSION: Supraspinatus and infraspinatus tendinopathy with a 1.6 cm from
front to back full-thickness tear of the anterior and far lateral
supraspinatus. Retraction is 2-3 cm. No atrophy.

Mild to moderate acromioclavicular osteoarthritis. Subacromial
spurring noted.

Fluid in the subacromial/subdeltoid bursa compatible with bursitis.

## 2020-02-12 DIAGNOSIS — Z9889 Other specified postprocedural states: Secondary | ICD-10-CM | POA: Diagnosis not present

## 2020-08-31 DIAGNOSIS — Z20822 Contact with and (suspected) exposure to covid-19: Secondary | ICD-10-CM | POA: Diagnosis not present

## 2022-06-27 ENCOUNTER — Encounter: Payer: Self-pay | Admitting: Nurse Practitioner

## 2022-06-27 ENCOUNTER — Ambulatory Visit (INDEPENDENT_AMBULATORY_CARE_PROVIDER_SITE_OTHER): Payer: BC Managed Care – PPO | Admitting: Nurse Practitioner

## 2022-06-27 VITALS — BP 132/96 | HR 72 | Temp 97.5°F | Resp 12 | Ht 72.0 in | Wt 215.0 lb

## 2022-06-27 DIAGNOSIS — Z0000 Encounter for general adult medical examination without abnormal findings: Secondary | ICD-10-CM | POA: Diagnosis not present

## 2022-06-27 DIAGNOSIS — R809 Proteinuria, unspecified: Secondary | ICD-10-CM

## 2022-06-27 DIAGNOSIS — Z125 Encounter for screening for malignant neoplasm of prostate: Secondary | ICD-10-CM | POA: Diagnosis not present

## 2022-06-27 DIAGNOSIS — M545 Low back pain, unspecified: Secondary | ICD-10-CM | POA: Diagnosis not present

## 2022-06-27 DIAGNOSIS — E663 Overweight: Secondary | ICD-10-CM | POA: Diagnosis not present

## 2022-06-27 LAB — LIPID PANEL
Cholesterol: 247 mg/dL — ABNORMAL HIGH (ref 0–200)
HDL: 41.4 mg/dL (ref >39.00)
LDL Cholesterol: 176 mg/dL — ABNORMAL HIGH (ref 0–99)
NonHDL: 205.69
Total CHOL/HDL Ratio: 6
Triglycerides: 148 mg/dL (ref 0.0–149.0)
VLDL: 29.6 mg/dL (ref 0.0–40.0)

## 2022-06-27 LAB — CBC
HCT: 43.4 % (ref 39.0–52.0)
Hemoglobin: 14.3 g/dL (ref 13.0–17.0)
MCHC: 33.1 g/dL (ref 30.0–36.0)
MCV: 89.2 fl (ref 78.0–100.0)
Platelets: 289 10*3/uL (ref 150.0–400.0)
RBC: 4.86 Mil/uL (ref 4.22–5.81)
RDW: 13.7 % (ref 11.5–15.5)
WBC: 8.8 10*3/uL (ref 4.0–10.5)

## 2022-06-27 LAB — COMPREHENSIVE METABOLIC PANEL
ALT: 13 U/L (ref 0–53)
AST: 13 U/L (ref 0–37)
Albumin: 4.6 g/dL (ref 3.5–5.2)
Alkaline Phosphatase: 80 U/L (ref 39–117)
BUN: 12 mg/dL (ref 6–23)
CO2: 28 mEq/L (ref 19–32)
Calcium: 9.6 mg/dL (ref 8.4–10.5)
Chloride: 104 mEq/L (ref 96–112)
Creatinine, Ser: 1.23 mg/dL (ref 0.40–1.50)
GFR: 65.31 mL/min (ref >60.00)
Glucose, Bld: 84 mg/dL (ref 70–99)
Potassium: 5 mEq/L (ref 3.5–5.1)
Sodium: 140 mEq/L (ref 135–145)
Total Bilirubin: 0.5 mg/dL (ref 0.2–1.2)
Total Protein: 7.3 g/dL (ref 6.0–8.3)

## 2022-06-27 LAB — TSH: TSH: 0.9 u[IU]/mL (ref 0.35–5.50)

## 2022-06-27 LAB — HEMOGLOBIN A1C: Hgb A1c MFr Bld: 5.7 % (ref 4.6–6.5)

## 2022-06-27 LAB — POC URINALSYSI DIPSTICK (AUTOMATED)
Bilirubin, UA: NEGATIVE
Blood, UA: NEGATIVE
Glucose, UA: NEGATIVE
Ketones, UA: NEGATIVE
Leukocytes, UA: NEGATIVE
Nitrite, UA: NEGATIVE
Protein, UA: POSITIVE — AB
Spec Grav, UA: >=1.03 — AB (ref 1.010–1.025)
Urobilinogen, UA: 0.2 E.U./dL
pH, UA: 5 (ref 5.0–8.0)

## 2022-06-27 LAB — PSA: PSA: 0.3 ng/mL (ref 0.10–4.00)

## 2022-06-27 NOTE — Progress Notes (Signed)
New Patient Office Visit  Subjective    Patient ID: Bruce Diaz, male    DOB: 06-28-1966  Age: 56 y.o. MRN: [id number]  CC:  Chief Complaint  Patient presents with   Establish Care    Previous PCP Kirk Ruths [facility] with this provider   Back Pain    X 1 month, on the lower right side of the back. Had some strong odor to the urine.     HPI Bruce Diaz presents to establish care   Back pain: Started approx 1 month ago it is on the right side. States that it started one day. No injury that he remebers. He does lift heavy objects daily. States that it can be described as a sharp, dull, and electric pain. States that it was worse with movement, palpation, and certain positions. Has used TENS unit and tylenol, with some relief. No dysuira or hematuria States that the urine got dark and ordor.   for complete physical and follow up of chronic conditions.  Immunizations: -Tetanus:2017 -Influenza: Out of season -Covid-19: refused -Shingles: information given -Pneumonia: Too young  -HPV: Aged out  Diet: Fair diet. Once cup of coffee and ice tea. States that with the warmer weather increased water and that seemed to help. Eats approx will have a breasfast bar and coffee in the morning. Will have lunch sometimes through the week. Eats dinner Exercise: No regular exercise. Just with employment   Eye exam: Completes annually. Every couple years [place]. Last visit was 2021  Dental exam: PRN   Colonoscopy: Completed in 2020. Behind . Off kirkpatrick recall 2030 Lung Cancer Screening: NA  Dexa: NA  PSA: Oldest brother had issues. Does not think it is cancer  Sleep:830-10pm bedtime will get up around 530am. Feels rested sometimes. States with his age feels less rested. Does not snore regularly     Outpatient Encounter Medications as of 06/27/2022  Medication Sig   [DISCONTINUED] ondansetron (ZOFRAN ODT) 4 MG disintegrating tablet Take 1 tablet (4 mg total) by  mouth every 8 (eight) hours as needed for nausea or vomiting.   No facility-administered encounter medications on file as of 06/27/2022.    Past Medical History:  Diagnosis Date   GERD (gastroesophageal reflux disease)    Medical history non-contributory     Past Surgical History:  Procedure Laterality Date   COLONOSCOPY     SHOULDER ARTHROSCOPY WITH OPEN ROTATOR CUFF REPAIR Right 05/09/2019   Procedure: SHOULDER ARTHROSCOPY WITH MINI  OPEN ROTATOR CUFF REPAIR, BICEPS TENDOSIS DISTAL CLAVICLE EXCISION SUBSCROMIAL DECOMPRESSION;  Surgeon: Signa Kell, MD;  Location: [facility];  Service: Orthopedics;  Laterality: Right;  ARTHREX BICEPS 1.9MM FIBERTAK,  STRYKER SPEED SUTURE ANCHOR, COBRA SUTURE PASSES OTHER ARTHROCARE WAND, BARREL BURR, OPEN SHID KOLB[place] HOMANE COBBS, ETC.   SHOULDER ARTHROSCOPY WITH OPEN ROTATOR CUFF REPAIR Left 09/02/2019   Procedure: SHOULDER ARTHROSCOPY WITH OPEN ROTATOR CUFF REPAIR, BICEPS TENODESIS, SUBACROMIAL DECOMPRESSION;  Surgeon: Signa Kell, MD;  Location: [facility];  Service: Orthopedics;  Laterality: Left;    Family History  Problem Relation Age of Onset   Hyperlipidemia Mother    Hypertension Mother    Heart attack Father    Breast cancer Sister    Arrhythmia Brother    Heart attack Brother    Hypertension Maternal Grandfather    Heart attack Paternal Grandfather     Social History   Socioeconomic History   Marital status: Married    Spouse name: Not on file  Number of children: 1   Years of education: Not on file   Highest education level: Not on file  Occupational History   Not on file  Tobacco Use   Smoking status: Never    Passive exposure: Current (wife smokes in the car)   Smokeless tobacco: Never  Vaping Use   Vaping Use: Never used  Substance and Sexual Activity   Alcohol use: Yes    Comment: occasional -not even once a month   Drug use: No   Sexual activity: Not on file  Other Topics Concern   Not on file  Social  History Narrative   Fulltime: Clinical biochemist   Social Determinants of Health   Financial Resource Strain: Not on file  Food Insecurity: Not on file  Transportation Needs: Not on file  Physical Activity: Not on file  Stress: Not on file  Social Connections: Not on file  Intimate Partner Violence: Not on file    Review of Systems  Constitutional:  Negative for chills and fever.  Respiratory:  Negative for shortness of breath.   Cardiovascular:  Negative for chest pain and leg swelling.  Gastrointestinal:  Negative for abdominal pain, blood in stool, constipation, diarrhea, nausea and vomiting.       BM daily   Genitourinary:  Negative for dysuria, frequency and hematuria.  Neurological:  Positive for tingling (left foot bottom numbness comes and goes.). Negative for headaches.  Psychiatric/Behavioral:  Negative for hallucinations and suicidal ideas.         Objective    BP (!) 132/96   Pulse 72   Temp (!) 97.5 F (36.4 C)   Resp 12   Ht 6' (1.829 m)   Wt 215 lb (97.5 kg)   SpO2 95%   BMI 29.16 kg/m   Physical Exam Vitals and nursing note reviewed. Exam conducted with a chaperone present [facility], RMA).  Constitutional:      Appearance: Normal appearance.  HENT:     Right Ear: Tympanic membrane, ear canal and external ear normal.     Left Ear: Tympanic membrane, ear canal and external ear normal.     Mouth/Throat:     Mouth: Mucous membranes are moist.     Pharynx: Oropharynx is clear.  Eyes:     Extraocular Movements: Extraocular movements intact.     Pupils: Pupils are equal, round, and reactive to light.     Comments: Wears glasses  Cardiovascular:     Rate and Rhythm: Normal rate and regular rhythm.     Heart sounds: Normal heart sounds.  Pulmonary:     Effort: Pulmonary effort is normal.     Breath sounds: Normal breath sounds.  Abdominal:     General: Bowel sounds are normal. There is no distension.     Palpations: There  is no mass.     Tenderness: There is no abdominal tenderness. There is no right CVA tenderness or left CVA tenderness.     Hernia: No hernia is present. There is no hernia in the left inguinal area or right inguinal area.  Genitourinary:    Penis: Normal.      Testes: Normal.     Epididymis:     Right: Normal.     Left: Normal.  Musculoskeletal:        General: No tenderness or signs of injury. Normal range of motion.       Arms:     Lumbar back: No tenderness or bony tenderness. Normal range of  motion. Negative right straight leg raise test and negative left straight leg raise test.     Right lower leg: No edema.     Left lower leg: No edema.     Comments: Cannot elicit tenderness in office.  Full lumbar range of motion  Lymphadenopathy:     Cervical: No cervical adenopathy.     Lower Body: No right inguinal adenopathy. No left inguinal adenopathy.  Skin:    General: Skin is warm.          Comments: Large varicose vein  Neurological:     General: No focal deficit present.     Mental Status: He is alert.     Deep Tendon Reflexes:     Reflex Scores:      Bicep reflexes are 2+ on the right side and 2+ on the left side.      Patellar reflexes are 2+ on the right side and 2+ on the left side.    Comments: Bilateral upper and lower extremity strength 5/5  Psychiatric:        Mood and Affect: Mood normal.        Behavior: Behavior normal.        Thought Content: Thought content normal.        Judgment: Judgment normal.         Assessment & Plan:   Problem List Items Addressed This Visit       Other   Acute right-sided low back pain without sciatica - Primary    Seems to be mechanical in nature cannot elicit in office.  Has improved.  We will continue to monitor check basic labs today      Relevant Orders   POCT Urinalysis Dipstick (Automated) (Completed)   Preventative health care    Did discuss age-appropriate immunizations and screening exams.      Relevant  Orders   CBC   Lipid panel   Comprehensive metabolic panel   Hemoglobin A1c   TSH   Overweight   Relevant Orders   Lipid panel   Comprehensive metabolic panel   Hemoglobin A1c   Other Visit Diagnoses     Screening for prostate cancer       Relevant Orders   PSA       Return in about 1 year (around 06/28/2023) for CPE and labs.   Romilda Garret, NP

## 2022-06-27 NOTE — Assessment & Plan Note (Signed)
Seems to be mechanical in nature cannot elicit in office.  Has improved.  We will continue to monitor check basic labs today

## 2022-06-27 NOTE — Assessment & Plan Note (Signed)
Did discuss age-appropriate immunizations and screening exams. ?

## 2022-06-27 NOTE — Patient Instructions (Signed)
Nice to see you today I will be in touch with the labs once I have them Follow up with me in 1 year for your next physical, sooner if you need me 

## 2022-06-27 NOTE — Assessment & Plan Note (Signed)
Discussed repeating with patient in 3 to 4 weeks.  He did sign the point he can just come by and give [place] a urine sample.  In light of him having some dehydration with specific gravity we will recheck to make sure he does have persistent protein if so did inform patient he will have to do 24-hour urine.

## 2022-06-28 ENCOUNTER — Telehealth: Payer: Self-pay

## 2022-06-28 NOTE — Telephone Encounter (Signed)
-----   Message from Eden Emms, NP sent at 06/28/2022  7:35 AM EDT ----- Can we review the labs with the patient please  Liver, kidneys, prostate number, thyroid, red and white blood cells look good. A1C is 5.7 which is the beginning of pre diabetes. His cholesterol is also elevated at 176. We want this number to be below 100. I would suggest eating more of a Mediterranean style diet and reducing added sugars in the diet. Also exercise can be helpful. I know that he does a lot with work. I would like to recheck the lipids in 6 months with a fasting lab   The 10-year ASCVD risk score (Arnett DK, et al., 2019) is: 10.2%   Values used to calculate the score:     Age: 56 years     Sex: Male     Is Non-Hispanic African American: No     Diabetic: No     Tobacco smoker: No     Systolic Blood Pressure: 132 mmHg     Is BP treated: No     HDL Cholesterol: 41.4 mg/dL     Total Cholesterol: 247 mg/dL

## 2022-06-28 NOTE — Telephone Encounter (Signed)
I left a message for the patient to return my call.

## 2022-07-09 ENCOUNTER — Observation Stay (HOSPITAL_COMMUNITY)
Admission: EM | Admit: 2022-07-09 | Discharge: 2022-07-10 | Disposition: A | Discharge status: Home / Self Care | Payer: BC Managed Care – PPO | Attending: Family Medicine | Admitting: Family Medicine

## 2022-07-09 ENCOUNTER — Emergency Department (HOSPITAL_COMMUNITY): Payer: BC Managed Care – PPO

## 2022-07-09 ENCOUNTER — Encounter (HOSPITAL_COMMUNITY): Payer: Self-pay

## 2022-07-09 ENCOUNTER — Observation Stay (HOSPITAL_COMMUNITY): Payer: BC Managed Care – PPO

## 2022-07-09 ENCOUNTER — Other Ambulatory Visit: Payer: Self-pay

## 2022-07-09 DIAGNOSIS — R410 Disorientation, unspecified: Secondary | ICD-10-CM | POA: Diagnosis not present

## 2022-07-09 DIAGNOSIS — R29818 Other symptoms and signs involving the nervous system: Secondary | ICD-10-CM | POA: Diagnosis not present

## 2022-07-09 DIAGNOSIS — T678XXA Other effects of heat and light, initial encounter: Secondary | ICD-10-CM | POA: Insufficient documentation

## 2022-07-09 DIAGNOSIS — M19012 Primary osteoarthritis, left shoulder: Secondary | ICD-10-CM | POA: Diagnosis not present

## 2022-07-09 DIAGNOSIS — R29810 Facial weakness: Secondary | ICD-10-CM

## 2022-07-09 DIAGNOSIS — Z20822 Contact with and (suspected) exposure to covid-19: Secondary | ICD-10-CM | POA: Insufficient documentation

## 2022-07-09 DIAGNOSIS — R55 Syncope and collapse: Secondary | ICD-10-CM | POA: Diagnosis not present

## 2022-07-09 DIAGNOSIS — R61 Generalized hyperhidrosis: Secondary | ICD-10-CM | POA: Diagnosis not present

## 2022-07-09 DIAGNOSIS — R7303 Prediabetes: Secondary | ICD-10-CM

## 2022-07-09 DIAGNOSIS — Z7722 Contact with and (suspected) exposure to environmental tobacco smoke (acute) (chronic): Secondary | ICD-10-CM | POA: Insufficient documentation

## 2022-07-09 DIAGNOSIS — E785 Hyperlipidemia, unspecified: Secondary | ICD-10-CM

## 2022-07-09 DIAGNOSIS — R531 Weakness: Secondary | ICD-10-CM | POA: Diagnosis not present

## 2022-07-09 DIAGNOSIS — T675XXA Heat exhaustion, unspecified, initial encounter: Secondary | ICD-10-CM | POA: Diagnosis not present

## 2022-07-09 DIAGNOSIS — R000 Tachycardia, unspecified: Secondary | ICD-10-CM | POA: Diagnosis not present

## 2022-07-09 DIAGNOSIS — G459 Transient cerebral ischemic attack, unspecified: Principal | ICD-10-CM | POA: Insufficient documentation

## 2022-07-09 DIAGNOSIS — R200 Anesthesia of skin: Secondary | ICD-10-CM

## 2022-07-09 DIAGNOSIS — M19011 Primary osteoarthritis, right shoulder: Secondary | ICD-10-CM | POA: Diagnosis not present

## 2022-07-09 LAB — COMPREHENSIVE METABOLIC PANEL
ALT: 17 U/L (ref 0–44)
AST: 20 U/L (ref 15–41)
Albumin: 4 g/dL (ref 3.5–5.0)
Alkaline Phosphatase: 67 U/L (ref 38–126)
Anion gap: 7 (ref 5–15)
BUN: 14 mg/dL (ref 6–20)
CO2: 24 mmol/L (ref 22–32)
Calcium: 9 mg/dL (ref 8.9–10.3)
Chloride: 109 mmol/L (ref 98–111)
Creatinine, Ser: 1.34 mg/dL — ABNORMAL HIGH (ref 0.61–1.24)
GFR, Estimated: >60 mL/min (ref >60)
Glucose, Bld: 95 mg/dL (ref 70–99)
Potassium: 3.9 mmol/L (ref 3.5–5.1)
Sodium: 140 mmol/L (ref 135–145)
Total Bilirubin: 1 mg/dL (ref 0.3–1.2)
Total Protein: 7 g/dL (ref 6.5–8.1)

## 2022-07-09 LAB — RESP PANEL BY RT-PCR (FLU A&B, COVID) ARPGX2
Influenza A by PCR: NEGATIVE
Influenza B by PCR: NEGATIVE
SARS Coronavirus 2 by RT PCR: NEGATIVE

## 2022-07-09 LAB — CBC
HCT: 44.3 % (ref 39.0–52.0)
Hemoglobin: 14.8 g/dL (ref 13.0–17.0)
MCH: 29.5 pg (ref 26.0–34.0)
MCHC: 33.4 g/dL (ref 30.0–36.0)
MCV: 88.2 fL (ref 80.0–100.0)
Platelets: 338 10*3/uL (ref 150–400)
RBC: 5.02 MIL/uL (ref 4.22–5.81)
RDW: 13.1 % (ref 11.5–15.5)
WBC: 16.3 10*3/uL — ABNORMAL HIGH (ref 4.0–10.5)
nRBC: 0 % (ref 0.0–0.2)

## 2022-07-09 LAB — I-STAT CHEM 8, ED
BUN: 20 mg/dL (ref 6–20)
Calcium, Ion: 1.06 mmol/L — ABNORMAL LOW (ref 1.15–1.40)
Chloride: 108 mmol/L (ref 98–111)
Creatinine, Ser: 1.3 mg/dL — ABNORMAL HIGH (ref 0.61–1.24)
Glucose, Bld: 87 mg/dL (ref 70–99)
HCT: 44 % (ref 39.0–52.0)
Hemoglobin: 15 g/dL (ref 13.0–17.0)
Potassium: 5.3 mmol/L — ABNORMAL HIGH (ref 3.5–5.1)
Sodium: 140 mmol/L (ref 135–145)
TCO2: 26 mmol/L (ref 22–32)

## 2022-07-09 LAB — PROTIME-INR
INR: 1.1 (ref 0.8–1.2)
Prothrombin Time: 14.4 seconds (ref 11.4–15.2)

## 2022-07-09 LAB — DIFFERENTIAL
Abs Immature Granulocytes: 0.08 10*3/uL — ABNORMAL HIGH (ref 0.00–0.07)
Basophils Absolute: 0.1 10*3/uL (ref 0.0–0.1)
Basophils Relative: 0 %
Eosinophils Absolute: 0 10*3/uL (ref 0.0–0.5)
Eosinophils Relative: 0 %
Immature Granulocytes: 1 %
Lymphocytes Relative: 10 %
Lymphs Abs: 1.6 10*3/uL (ref 0.7–4.0)
Monocytes Absolute: 1.2 10*3/uL — ABNORMAL HIGH (ref 0.1–1.0)
Monocytes Relative: 8 %
Neutro Abs: 13.2 10*3/uL — ABNORMAL HIGH (ref 1.7–7.7)
Neutrophils Relative %: 81 %

## 2022-07-09 LAB — ETHANOL: Alcohol, Ethyl (B): <10 mg/dL (ref <10)

## 2022-07-09 LAB — CBG MONITORING, ED: Glucose-Capillary: 83 mg/dL (ref 70–99)

## 2022-07-09 LAB — TROPONIN I (HIGH SENSITIVITY)
Troponin I (High Sensitivity): 5 ng/L (ref <18)
Troponin I (High Sensitivity): 4 ng/L (ref <18)

## 2022-07-09 LAB — APTT: aPTT: 26 seconds (ref 24–36)

## 2022-07-09 LAB — CK: Total CK: 66 U/L (ref 49–397)

## 2022-07-09 MED ORDER — SODIUM CHLORIDE 0.9 % IV SOLN: INTRAVENOUS | Status: DC

## 2022-07-09 MED ORDER — ASPIRIN 81 MG PO TBEC
81.0000 mg | DELAYED_RELEASE_TABLET | Freq: Every day | ORAL | Status: DC
  Administered 2022-07-10: 81 mg via ORAL
  Filled 2022-07-09: qty 1

## 2022-07-09 MED ORDER — IOHEXOL 350 MG/ML SOLN
75.0000 mL | Freq: Once | INTRAVENOUS | Status: AC | PRN
  Administered 2022-07-09: 75 mL via INTRAVENOUS

## 2022-07-09 MED ORDER — SODIUM CHLORIDE 0.9 % IV BOLUS
1000.0000 mL | Freq: Once | INTRAVENOUS | Status: AC
  Administered 2022-07-09: 1000 mL via INTRAVENOUS

## 2022-07-09 MED ORDER — ENOXAPARIN SODIUM 40 MG/0.4ML IJ SOSY
40.0000 mg | PREFILLED_SYRINGE | INTRAMUSCULAR | Status: DC
  Administered 2022-07-09: 40 mg via SUBCUTANEOUS
  Filled 2022-07-09: qty 0.4

## 2022-07-09 MED ORDER — ACETAMINOPHEN 160 MG/5ML PO SOLN: 650.0000 mg | ORAL | Status: DC | PRN

## 2022-07-09 MED ORDER — STROKE: EARLY STAGES OF RECOVERY BOOK
Freq: Once | Status: AC
  Filled 2022-07-09: qty 1

## 2022-07-09 MED ORDER — ACETAMINOPHEN 325 MG PO TABS: 650.0000 mg | ORAL_TABLET | ORAL | Status: DC | PRN

## 2022-07-09 MED ORDER — SENNOSIDES-DOCUSATE SODIUM 8.6-50 MG PO TABS: 1.0000 | ORAL_TABLET | Freq: Every evening | ORAL | Status: DC | PRN

## 2022-07-09 MED ORDER — ACETAMINOPHEN 650 MG RE SUPP: 650.0000 mg | RECTAL | Status: DC | PRN

## 2022-07-09 NOTE — Assessment & Plan Note (Addendum)
TIA most likely with symptoms now resolved. CT Head and CTA neck negative.  Awaiting MRI brain. -Admit to Family Medicine Teaching Service with attending Dr. Janit Pagan -Neurology consulted, recs appreciated -Start aspirin 81 mg daily -MRI brain pending shoulder XR for possible hardware -ECHO -Lipid Panel -Neuro checks q2h -Vitals per floor routine - IV fluids with normal saline at 75 mL/hr

## 2022-07-09 NOTE — Assessment & Plan Note (Addendum)
Newly diagnosed by newly established PCP.  LDL 176 a couple weeks ago.  No meds.  Because of this event, patient now qualifies for secondary prevention with LDL goal <70.  Patient and wife report he would never take a statin because his sister died of liver failure associated with her statin.  Unclear if this is genetic or if this patient is at risk of the same outcome.  Discussed that we can consider alternative cholesterol lowering medications.

## 2022-07-09 NOTE — Consult Note (Addendum)
Neurology Consultation  Reason for Consult: Code stroke  Referring Physician: Dr. Rush Landmark  CC: Heat Exposure  History is obtained from:medical record and RN  HPI: Bruce Diaz is a 56 y.o. 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 LKW 1000.  On arrival to the ED symptoms had resolved except for tingling to the bilateral hands. Code stroke was called @ 1445 for a left facial droop and arm numbness. CT head negative with ASPECTS 10.  NIHSS 1   LKW: 1000 IV thrombolysis given?: no, mild symptoms  Premorbid modified Rankin scale (mRS):  0-Completely asymptomatic and back to baseline post-stroke  ROS: Full ROS was performed and is negative except as noted in the HPI.     Past Medical History:  Diagnosis Date   GERD (gastroesophageal reflux disease)    Medical history non-contributory      Family History  Problem Relation Age of Onset   Hyperlipidemia Mother    Hypertension Mother    Heart attack Father    Breast cancer Sister    Arrhythmia Brother    Heart attack Brother    Hypertension Maternal Grandfather    Heart attack Paternal Grandfather      Social History:   reports that he has never smoked. He has been exposed to tobacco smoke. He has never used smokeless tobacco. He reports current alcohol use. He reports that he does not use drugs.  Medications  Current Facility-Administered Medications:    sodium chloride 0.9 % bolus 1,000 mL, 1,000 mL, Intravenous, Once, Tegeler, Canary Brim, MD No current outpatient medications on file.   Exam: Current vital signs: BP 117/84   Pulse 80   Temp 97.6 F (36.4 C)   Resp 20   Ht 6' (1.829 m)   Wt 97.5 kg   SpO2 99%   BMI 29.16 kg/m  Vital signs in last 24 hours: Temp:  [97.6 F (36.4 C)] 97.6 F (36.4 C) (07/29 1327) Pulse Rate:  [80]  80 (07/29 1327) Resp:  [20] 20 (07/29 1327) BP: (117)/(84) 117/84 (07/29 1327) SpO2:  [99 %] 99 % (07/29 1327) Weight:  [97.5 kg] 97.5 kg (07/29 1403)  GENERAL: Awake, alert in NAD HEENT: - Normocephalic and atraumatic, dry mm LUNGS - Clear to auscultation bilaterally with no wheezes CV - S1S2 RRR, no m/r/g, equal pulses bilaterally. ABDOMEN - Soft, nontender, nondistended with normoactive BS Ext: warm, well perfused, intact peripheral pulses, no edema  NEURO:  Mental Status: AA&Ox4 Language: speech is clear  Naming, repetition, fluency, and comprehension intact. Cranial Nerves: PERRL, EOMI, visual fields full, left facial asymmetry, facial sensation intact, hearing intact, tongue/uvula/soft palate midline, normal sternocleidomastoid and trapezius muscle strength. No evidence of tongue atrophy or fibrillations Motor: 5/5 in all 4 extremities Tone: is normal and bulk is normal Sensation- Intact to light touch bilaterally Coordination: FTN intact bilaterally, no ataxia in BLE. Gait- deferred  NIHSS 1a Level of Conscious.: 0 1b LOC Questions: 0 1c LOC Commands: 0 2 Best Gaze: 0 3 Visual: 0 4 Facial Palsy: 1 5a Motor Arm - left: 0 5b Motor Arm - Right: 0 6a Motor Leg - Left: 0 6b Motor Leg - Right: 0 7 Limb Ataxia: 0 8 Sensory: 0 9 Best Language: 0 10 Dysarthria: 0 11 Extinct. and Inatten.: 0 TOTAL: 1   Labs I have  reviewed labs in epic and the results pertinent to this consultation are:  K 5.3 Cr 1.30  Imaging I have reviewed the images obtained:  CT-head normal. ASPECTS 10    Assessment:  Bruce Diaz is a 56 y.o. 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 LKW 1000.  On arrival to the ED symptoms had resolved except for tingling to the bilateral hands.   Impression: Possible stroke vs  TIA vs Heat exhaustion/ dehydration  Recommendations: - HgbA1c, fasting lipid panel - MRI of the brain without contrast - Frequent neuro checks - Echocardiogram - CTA head and neck - Prophylactic therapy-Antiplatelet med: Aspirin - dose 81mg   - Hydrate with IVF  - Check UDS, UA, CK CMP, CBC - Risk factor modification - Telemetry monitoring - PT consult, OT consult, Speech consult - Stroke team to follow   DNP, ACNPC-AG   I have seen the patient reviewed the above note.  The patient reports bilateral, his deficits seen on exam were unilateral on the left.  I am concerned for transient ischemic attack, possibly posterior circulation.  He will need to be admitted for TIA work-up.   Gevena Mart, MD [facility] [phone]  If 7pm- 7am, please page neurology on call as listed in AMION.

## 2022-07-09 NOTE — Progress Notes (Signed)
Family medicine teaching service will be admitting this patient. Our pager information can be located in the physician sticky notes, treatment team sticky notes, and the headers of all our official daily progress notes.   FAMILY MEDICINE TEACHING SERVICE Patient - Please contact intern pager [phone] or text page via website [email or web address] (login: [email or web address]) for questions regarding care. DO NOT page listed attending provider unless there is no answer from the number above.   Bruce Huseby, MD PGY-3, [place] [facility] Service pager [phone]   

## 2022-07-09 NOTE — Assessment & Plan Note (Signed)
Recently diagnosed by new PCP, A1c 5.7.  No home meds.  Do not anticipate glycemic control needs while admitted.

## 2022-07-09 NOTE — ED Triage Notes (Signed)
Pt arrived via EMS, working at [employer] around 1000, did not [employer] and started having sx diaphoresis, confusion, tingling in face and arms, difficulty speaking. All sx resolved upon arrival except tingling to bilat hands, A&Ox4 currently

## 2022-07-09 NOTE — H&P (Signed)
Hospital Admission History and Physical Service Pager: [phone]  Patient name: Bruce Diaz Medical record number: [id number] Date of Birth: Jan 17, 1966 Age: 56 y.o. Gender: male  Primary Care Provider: Eden Emms, NP Consultants: Neurology Code Status: Full Preferred Emergency Contact:  Wife Daray, Polgar (Spouse)  [phone] (Home Phone)  Chief Complaint: Episode of being swimmy headed, sweaty, slurring words  Assessment and Plan: Bruce Diaz is a 56 y.o. male presenting via EMS for questionable syncope, weakness, and dizziness.  In ED, found to have left-sided facial droop and extremity weakness.  Presentation most concerning for TIA.  Differential otherwise includes stroke, hypoglycemia, complicated migraine, heat exhaustion, partial seizure, and orthostatic hypotension.   * TIA (transient ischemic attack) TIA most likely with symptoms now resolved. CT Head and CTA neck negative.  Awaiting MRI brain. -Admit to Family Medicine Teaching Service with attending Dr. Janit Pagan -Neurology consulted, recs appreciated -Start aspirin 81 mg daily -MRI brain pending shoulder XR for possible hardware -ECHO -Lipid Panel -Neuro checks q2h -Vitals per floor routine - IV fluids with normal saline at 75 mL/hr  Hyperlipidemia Newly diagnosed by newly established PCP.  LDL 176 a couple weeks ago.  No meds.  Because of this event, patient now qualifies for secondary prevention with LDL goal <70.  Patient and wife report he would never take a statin because his sister died of liver failure associated with her statin.  Unclear if this is genetic or if this patient is at risk of the same outcome.  Discussed that we can consider alternative cholesterol lowering medications.  Prediabetes Recently diagnosed by new PCP, A1c 5.7.  No home meds.  Do not anticipate glycemic control needs while admitted.  Symptoms are resolved. FEN/GI: Regular  VTE Prophylaxis: Lovenox  Disposition:  progressive for neuro checks  History of Present Illness:  Bruce Diaz is a 56 y.o. male presenting with sudden dizziness and slurred speech.  Patient reports that his symptoms started 1000-1030 while he was outside in the heat working on his vehicle, he felt dizzy, and "swimmy headed." He called a friend because he was worried but the friend couldn't understand him because of "unintelligible speech". He reports symptoms started when he bent down to pick something up. After being on the phone with friend he is uncertain if he lost consciousness or not but he did end up on the ground.  ED provider reports he may have been on the ground passed out for about 20 minutes.  His friend called EMS and he required help from EMS to stand up and get in to the ambulance. Reports associated numbness of his left fingers, and generalized weakness. His symptoms have passed now. Patient reports drinking very little over past 24hrs except for coffee this morning and has eaten since dinner yesterday. He is hungry now.   In the ED, vitals stable, WBC 16.3, Cr 1.34, and K+ 5.3. CT Head and CTA neck were negative for stroke.   Review Of Systems: Per HPI with the following additions:  Denies chest pain, shortness of breath Did have sweating, slurred speech  Pertinent Past Medical History: High cholesterol diagnosed 1.5 weeks ago Prediabetes, A1c 5.7 diagnosed 1.5 weeks ago Denies previous heart and lung issues  Remainder reviewed in history tab.   Pertinent Past Surgical History: Bilateral shoulder surgery  Remainder reviewed in history tab.   Pertinent Social History: Tobacco use: None Alcohol use: None Other Substance use: None Lives with wife  Pertinent Family History: Brother -  passed from COVID, had MI x2 during life Grandpa - died from MI Sister - breast cancer Mom - stroke x2 or 3  Remainder reviewed in history tab.   Important Outpatient Medications: No prescribed medications Very seldom  OTC meds  Allergic to benadryl - hallucinations  Remainder reviewed in medication history.   Objective: BP 130/80   Pulse 96   Temp 97.7 F (36.5 C)   Resp 20   Ht 6' (1.829 m)   Wt 97.5 kg   SpO2 98%   BMI 29.16 kg/m  Exam: General: A&O, NAD HEENT: No sign of trauma, EOM grossly intact Cardiac: RRR, no m/r/g Respiratory: CTAB, normal WOB, no w/c/r GI: Soft, NTTP, non-distended  Extremities: NTTP, no peripheral edema. Psych: Appropriate mood and affect Neuro: Memory: Intact . PEERLA. Cranial nerves: II through XII are intact. Sensation normal upper and lower ext's bilaterally. Strength 5/5 in upper and lower ext's bilaterally. FTN, rapid hand alternating movements normal bilaterally. Rhomberg normal. Protator drift normal. Gait normal.   Labs:  CBC BMET  Recent Labs  Lab 07/09/22 1450 07/09/22 1505  WBC 16.3*  --   HGB 14.8 15.0  HCT 44.3 44.0  PLT 338  --    Recent Labs  Lab 07/09/22 1450 07/09/22 1505  NA 140 140  K 3.9 5.3*  CL 109 108  CO2 24  --   BUN 14 20  CREATININE 1.34* 1.30*  GLUCOSE 95 87  CALCIUM 9.0  --     CK 66 Troponin 5 Respiratory panel negative for COVID, flu A, flu B  EKG: Sinus rhythm, no ST segment changes   Imaging Studies Performed: CT HEAD CODE STROKE WO CONTRAST IMPRESSION: Negative CT of the head. Aspects 10/10  CT ANGIO HEAD W OR WO CONTRAST  IMPRESSION: 1. Negative CTA of the neck. 2. Normal CTA circle-of-Willis without significant proximal stenosis, aneurysm, or branch vessel occlusion. 3. Degenerative changes of the cervical spine are most pronounced at C5-6.  LEFT SHOULDER - 2+ VIEW; RIGHT SHOULDER - 2+ VIEW COMPARISON:  None Available. FINDINGS: No metallic foreign body in either shoulder or the visualized thorax. Visualized lung fields are clear. Mild osteoarthritis of the right acromioclavicular joint. IMPRESSION: No metallic foreign body.  Patient is clear for MRI.  Celine Mans, MD 07/09/2022,  5:59 PM PGY-1, [facility] Family Medicine FPTS Intern pager: [phone], text pages welcome Secure chat group [facility] Teaching Service   Upper Level Addendum: I have seen and evaluated this patient along with Dr. Velna Ochs and reviewed the above note, making necessary revisions as appropriate. I agree with the medical decision making and physical exam as noted above. Fayette Pho, MD PGY-3 [facility] Family Medicine Residency

## 2022-07-09 NOTE — ED Provider Notes (Signed)
[facility] EMERGENCY DEPARTMENT Provider Note   CSN: [id number] Arrival date & time: 07/09/22  1323     History  Chief Complaint  Patient presents with   Heat Exposure    Bruce Diaz is a 56 y.o. male.  The history is provided by the patient and medical records. No language interpreter was used.  Neurologic Problem This is a new problem. The current episode started 1 to 2 hours ago. The problem occurs constantly. The problem has been gradually improving. Pertinent negatives include no chest pain, no abdominal pain, no headaches and no shortness of breath. Nothing aggravates the symptoms. Nothing relieves the symptoms. He has tried nothing for the symptoms. The treatment provided no relief.       Home Medications Prior to Admission medications   Not on File      Allergies    Diphenhydramine and Other    Review of Systems   Review of Systems  Constitutional:  Positive for diaphoresis and fatigue. Negative for chills and fever.  HENT:  Negative for congestion.   Eyes:  Negative for visual disturbance.  Respiratory:  Negative for cough, chest tightness, shortness of breath and wheezing.   Cardiovascular:  Negative for chest pain and palpitations.  Gastrointestinal:  Negative for abdominal pain, constipation, diarrhea, nausea and vomiting.  Musculoskeletal:  Negative for back pain, gait problem, neck pain and neck stiffness.  Skin:  Negative for rash and wound.  Neurological:  Positive for dizziness, syncope, facial asymmetry, weakness, light-headedness and numbness. Negative for headaches.  Psychiatric/Behavioral:  Negative for agitation and confusion.   All other systems reviewed and are negative.   Physical Exam Updated Vital Signs BP 117/84   Pulse 80   Temp 97.6 F (36.4 C)   Resp 20   Ht 6' (1.829 m)   Wt 97.5 kg   SpO2 99%   BMI 29.16 kg/m  Physical Exam Vitals and nursing note reviewed.  Constitutional:      General: He is not in  acute distress.    Appearance: He is well-developed. He is not ill-appearing, toxic-appearing or diaphoretic.  HENT:     Nose: No congestion or rhinorrhea.     Mouth/Throat:     Mouth: Mucous membranes are moist.     Pharynx: No oropharyngeal exudate.  Eyes:     Conjunctiva/sclera: Conjunctivae normal.  Neck:     Vascular: No carotid bruit.  Cardiovascular:     Rate and Rhythm: Normal rate and regular rhythm.     Heart sounds: No murmur heard. Pulmonary:     Effort: Pulmonary effort is normal. No respiratory distress.     Breath sounds: Normal breath sounds. No wheezing, rhonchi or rales.  Chest:     Chest wall: No tenderness.  Abdominal:     General: Abdomen is flat.     Palpations: Abdomen is soft.     Tenderness: There is no abdominal tenderness. There is no right CVA tenderness, left CVA tenderness, guarding or rebound.  Musculoskeletal:        General: No swelling or tenderness.     Cervical back: Neck supple. No tenderness.     Right lower leg: No edema.     Left lower leg: No edema.  Skin:    General: Skin is warm and dry.     Capillary Refill: Capillary refill takes less than 2 seconds.     Findings: No erythema or rash.  Neurological:     Mental Status: He is  alert.     Cranial Nerves: Facial asymmetry present.     Sensory: Sensory deficit present.     Motor: Weakness present.     Coordination: Finger-Nose-Finger Test normal.     Comments: Left facial droop, left arm grip strength decreased, left leg weakness, left face and left arm numbness compared to right.  Symptoms are improving on reassessment.  Psychiatric:        Mood and Affect: Mood normal.     ED Results / Procedures / Treatments   Labs (all labs ordered are listed, but only abnormal results are displayed) Labs Reviewed  CBC - Abnormal; Notable for the following components:      Result Value   WBC 16.3 (*)    All other components within normal limits  DIFFERENTIAL - Abnormal; Notable for the  following components:   Neutro Abs 13.2 (*)    Monocytes Absolute 1.2 (*)    Abs Immature Granulocytes 0.08 (*)    All other components within normal limits  COMPREHENSIVE METABOLIC PANEL - Abnormal; Notable for the following components:   Creatinine, Ser 1.34 (*)    All other components within normal limits  I-STAT CHEM 8, ED - Abnormal; Notable for the following components:   Potassium 5.3 (*)    Creatinine, Ser 1.30 (*)    Calcium, Ion 1.06 (*)    All other components within normal limits  RESP PANEL BY RT-PCR (FLU A&B, COVID) ARPGX2  ETHANOL  PROTIME-INR  APTT  CK  RAPID URINE DRUG SCREEN, HOSP PERFORMED  URINALYSIS, ROUTINE W REFLEX MICROSCOPIC  CBG MONITORING, ED  TROPONIN I (HIGH SENSITIVITY)  TROPONIN I (HIGH SENSITIVITY)    EKG EKG Interpretation  Date/Time:  Saturday July 09 2022 15:16:53 EDT Ventricular Rate:  81 PR Interval:  133 QRS Duration: 105 QT Interval:  380 QTC Calculation: 442 R Axis:   68 Text Interpretation: Sinus rhythm Abnormal R-wave progression, early transition no prior ECG for comparison. No STEMI Confirmed by Theda Belfast ([id number]) on 07/09/2022 4:07:07 PM  Radiology CT ANGIO HEAD W OR WO CONTRAST  Result Date: 07/09/2022 CLINICAL DATA:  Acute onset of left facial droop and numbness. EXAM: CT ANGIOGRAPHY HEAD AND NECK TECHNIQUE: Multidetector CT imaging of the head and neck was performed using the standard protocol during bolus administration of intravenous contrast. Multiplanar CT image reconstructions and MIPs were obtained to evaluate the vascular anatomy. Carotid stenosis measurements (when applicable) are obtained utilizing NASCET criteria, using the distal internal carotid diameter as the denominator. RADIATION DOSE REDUCTION: This exam was performed according to the departmental dose-optimization program which includes automated exposure control, adjustment of the mA and/or kV according to patient size and/or use of iterative reconstruction  technique. CONTRAST:  47mL OMNIPAQUE IOHEXOL 350 MG/ML SOLN COMPARISON:  CT head without contrast 07/09/2022 FINDINGS: CTA NECK FINDINGS Aortic arch: A 3 vessel arch configuration present. No focal vascular disease is evident. Right carotid system: Right common carotid artery is within normal limits. Bifurcation is unremarkable. Cervical right ICA is normal. Left carotid system: The left common carotid artery is within normal limits. Bifurcation is unremarkable. Cervical left ICA is normal. Vertebral arteries: The right vertebral artery is the dominant vessel. Both vertebral arteries originate from the subclavian arteries without significant stenosis. No significant stenosis is present in either vertebral artery in the neck. Skeleton: Degenerative changes are most pronounced at C5-6. No focal osseous lesions are present. Other neck: Soft tissues the neck are otherwise unremarkable. Salivary glands are within normal limits. Thyroid  is normal. No significant adenopathy is present. No focal mucosal or submucosal lesions are present. Upper chest: The lung apices are clear. Thoracic inlet is within normal limits. Review of the MIP images confirms the above findings CTA HEAD FINDINGS Anterior circulation: The internal carotid arteries are within normal limits through the ICA termini. The A1 and M1 segments are normal. The anterior communicating artery is patent. MCA bifurcations are within normal limits. ACA and MCA branch vessels are normal. Posterior circulation: The right vertebral artery is dominant vessel. PICA origins are visualized normal. The vertebrobasilar junction basilar artery is normal. Both posterior cerebral arteries originate from basilar tip. PCA branch vessels within normal limits bilaterally. Venous sinuses: The dural sinuses are patent. The straight sinus deep cerebral veins are intact. Cortical veins are within normal limits. No significant vascular malformation is evident. Anatomic variants: None  Review of the MIP images confirms the above findings IMPRESSION: 1. Negative CTA of the neck. 2. Normal CTA circle-of-Willis without significant proximal stenosis, aneurysm, or branch vessel occlusion. 3. Degenerative changes of the cervical spine are most pronounced at C5-6. Electronically Signed   By: Marin Roberts M.D.   On: 07/09/2022 15:37   CT HEAD CODE STROKE WO CONTRAST  Result Date: 07/09/2022 CLINICAL DATA:  Code stroke.  Left facial droop. Arm numbness. EXAM: CT HEAD WITHOUT CONTRAST TECHNIQUE: Contiguous axial images were obtained from the base of the skull through the vertex without intravenous contrast. RADIATION DOSE REDUCTION: This exam was performed according to the departmental dose-optimization program which includes automated exposure control, adjustment of the mA and/or kV according to patient size and/or use of iterative reconstruction technique. COMPARISON:  CT head without contrast 09/05/2016 FINDINGS: Brain: No acute infarct, hemorrhage, or mass lesion is present. No significant white matter lesions are present. Basal ganglia are intact. Insular ribbon is normal. The ventricles are of normal size. No significant extraaxial fluid collection is present. Vascular: No hyperdense vessel or unexpected calcification. Skull: Calvarium is intact. No focal lytic or blastic lesions are present. No significant extracranial soft tissue lesion is present. Sinuses/Orbits: The paranasal sinuses and mastoid air cells are clear. The globes and orbits are within normal limits. ASPECTS [facility] Stroke Program Early CT Score) - Ganglionic level infarction (caudate, lentiform nuclei, internal capsule, insula, M1-M3 cortex): 7/7 - Supraganglionic infarction (M4-M6 cortex): 3/3 Total score (0-10 with 10 being normal): 10/10 IMPRESSION: Negative CT of the head. Aspects 10/10 The above was relayed via text pager to Dr. Amada Jupiter on 07/09/2022 at 15:10 . * Electronically Signed   By: Marin Roberts  M.D.   On: 07/09/2022 15:11    Procedures Procedures    Medications Ordered in ED Medications  aspirin EC tablet 81 mg (has no administration in time range)  sodium chloride 0.9 % bolus 1,000 mL (1,000 mLs Intravenous New Bag/Given 07/09/22 1529)  iohexol (OMNIPAQUE) 350 MG/ML injection 75 mL (75 mLs Intravenous Contrast Given 07/09/22 1515)    ED Course/ Medical Decision Making/ A&P Clinical Course as of 07/09/22 1641  Sat Jul 09, 2022  1618 Received sign out pending admission to medicine for TIA vs stroke, pending MRI. Also with syncope.  [WS]    Clinical Course User Index [WS] Lonell Grandchild, MD                           Medical Decision Making Amount and/or Complexity of Data Reviewed Labs: ordered. Radiology: ordered.    Bruce Diaz is a  56 y.o. male with a past medical history significant for GERD who presents for syncope and altered mental status in the setting of suspected heat exhaustion.  According to nursing report from EMS, patient was working outside today under a car and had not had fluids.  He began his work at 10 AM but at noon he started feeling abnormal.  He reports that he was having dizziness, lightheadedness, weakness in his legs with left worse than right, left arm symptoms, and had some difficulty speaking.  He reports that his words were not sounding right coming out.  He then had a syncopal episode where he was out for possibly up to 20 minutes.  He was brought in for evaluation and was given fluids with EMS with improvement in his overall appearance.  Patient denies any chest pain, palpitations, shortness of breath.  Denying any headache, neck pain, neck stiffness.  He reports that his speech is improved but family was concerned because they felt he had some left-sided facial droop that is new.  Patient also was having left leg weakness and left arm feeling numb and subjective weakness.  I went to assess the patient and patient does indeed have left  facial droop, decreased grip strength on the left, numbness on the left hand, and very subtle weakness in the left leg.  He was not have any dysarthria as the family seem to describe but as he is having persistent symptoms and they began just over 2 hours ago, we will activate a code stroke.  By the time neurology came to see him, his facial droop was starting to improve.  They agreed with activation of code stroke.  Due to the patient's syncope and lightheadedness, we will get work-up to rule out a cardiac cause of symptoms we will also get the code stroke imaging of his head and neck.  Anticipate follow-up on neurology recommendations for what appears to be either TIA versus acute stroke.   Care transferred to oncoming team to await neurology recommendations.  Neurology recommends admission for further work-up of suspected TIA versus stroke and syncope.  They recommended MRI which they will order and patient will be admitted for further management        Final Clinical Impression(s) / ED Diagnoses Final diagnoses:  Syncope, unspecified syncope type  Facial droop  Left sided numbness  Left-sided weakness    Clinical Impression: 1. Syncope, unspecified syncope type   2. Facial droop   3. Left sided numbness   4. Left-sided weakness     Disposition: Admit  This note was prepared with assistance of Dragon voice recognition software. Occasional wrong-word or sound-a-like substitutions may have occurred due to the inherent limitations of voice recognition software.       Andron Marrazzo, Canary Brim, MD 07/09/22 [phone]

## 2022-07-10 ENCOUNTER — Observation Stay (HOSPITAL_BASED_OUTPATIENT_CLINIC_OR_DEPARTMENT_OTHER): Payer: BC Managed Care – PPO

## 2022-07-10 DIAGNOSIS — R29810 Facial weakness: Secondary | ICD-10-CM | POA: Diagnosis not present

## 2022-07-10 DIAGNOSIS — R55 Syncope and collapse: Secondary | ICD-10-CM

## 2022-07-10 DIAGNOSIS — R7303 Prediabetes: Secondary | ICD-10-CM | POA: Diagnosis not present

## 2022-07-10 DIAGNOSIS — G459 Transient cerebral ischemic attack, unspecified: Secondary | ICD-10-CM | POA: Diagnosis not present

## 2022-07-10 LAB — LIPID PANEL
Cholesterol: 226 mg/dL — ABNORMAL HIGH (ref 0–200)
HDL: 36 mg/dL — ABNORMAL LOW (ref >40)
LDL Cholesterol: 164 mg/dL — ABNORMAL HIGH (ref 0–99)
Total CHOL/HDL Ratio: 6.3 RATIO
Triglycerides: 132 mg/dL (ref <150)
VLDL: 26 mg/dL (ref 0–40)

## 2022-07-10 LAB — COMPREHENSIVE METABOLIC PANEL
ALT: 13 U/L (ref 0–44)
AST: 14 U/L — ABNORMAL LOW (ref 15–41)
Albumin: 3.4 g/dL — ABNORMAL LOW (ref 3.5–5.0)
Alkaline Phosphatase: 61 U/L (ref 38–126)
Anion gap: 7 (ref 5–15)
BUN: 14 mg/dL (ref 6–20)
CO2: 22 mmol/L (ref 22–32)
Calcium: 8.5 mg/dL — ABNORMAL LOW (ref 8.9–10.3)
Chloride: 109 mmol/L (ref 98–111)
Creatinine, Ser: 1.12 mg/dL (ref 0.61–1.24)
GFR, Estimated: >60 mL/min (ref >60)
Glucose, Bld: 100 mg/dL — ABNORMAL HIGH (ref 70–99)
Potassium: 4.2 mmol/L (ref 3.5–5.1)
Sodium: 138 mmol/L (ref 135–145)
Total Bilirubin: 0.5 mg/dL (ref 0.3–1.2)
Total Protein: 6.3 g/dL — ABNORMAL LOW (ref 6.5–8.1)

## 2022-07-10 LAB — ECHOCARDIOGRAM COMPLETE
Area-P 1/2: 5.02 cm2
Height: 72 in
S' Lateral: 3.8 cm
Weight: 3440 oz

## 2022-07-10 LAB — CBC
HCT: 40.1 % (ref 39.0–52.0)
HCT: 42.3 % (ref 39.0–52.0)
Hemoglobin: 13.5 g/dL (ref 13.0–17.0)
Hemoglobin: 13.9 g/dL (ref 13.0–17.0)
MCH: 29.7 pg (ref 26.0–34.0)
MCH: 29.3 pg (ref 26.0–34.0)
MCHC: 33.7 g/dL (ref 30.0–36.0)
MCHC: 32.9 g/dL (ref 30.0–36.0)
MCV: 88.3 fL (ref 80.0–100.0)
MCV: 89.1 fL (ref 80.0–100.0)
Platelets: 257 10*3/uL (ref 150–400)
Platelets: 285 10*3/uL (ref 150–400)
RBC: 4.54 MIL/uL (ref 4.22–5.81)
RBC: 4.75 MIL/uL (ref 4.22–5.81)
RDW: 13.2 % (ref 11.5–15.5)
RDW: 13.3 % (ref 11.5–15.5)
WBC: 6.7 10*3/uL (ref 4.0–10.5)
WBC: 6.4 10*3/uL (ref 4.0–10.5)
nRBC: 0 % (ref 0.0–0.2)
nRBC: 0 % (ref 0.0–0.2)

## 2022-07-10 MED ORDER — ASPIRIN 81 MG PO TBEC: 81.0000 mg | DELAYED_RELEASE_TABLET | Freq: Every day | ORAL | 12 refills | Status: DC

## 2022-07-10 NOTE — Hospital Course (Addendum)
Bruce Diaz is a 56 y.o. male with a history of HLD, prediabetes, GERD who presented as a code stroke with sudden onset facial droop, tingling in arms and face in the setting of dehydration concerning for TIA. Hospital course outlined by problem below:  TIA vs heat exhaustion  Patient admitted for TIA work-up. Neurology was consulted. CTA head/neck and MRI brain were unremarkable, no evidence of stroke.  He received IV fluids.  Echocardiogram obtained unremarkable.  He was started on aspirin.  Patient was advised to start statin but patient declined.  Patient had complete symptom resolution by time of discharge.

## 2022-07-10 NOTE — Discharge Summary (Signed)
Family Medicine [facility] Discharge Summary  Patient name: Bruce Diaz Medical record number: [id number] Date of birth: 12-20-1965 Age: 56 y.o. Gender: male Date of Admission: 07/09/2022  Date of Discharge: 07/10/2022  Admitting Physician: Celine Mans, MD  Primary Care Provider: Eden Emms, NP Consultants: neurology  Indication for Hospitalization: Facial droop  Brief Hospital Course:  Bruce Diaz is a 56 y.o. male with a history of HLD, prediabetes, GERD who presented as a code stroke with sudden onset facial droop, tingling in arms and face in the setting of dehydration concerning for TIA. Hospital course outlined by problem below:  TIA vs heat exhaustion  Patient admitted for TIA work-up. Neurology was consulted. CTA head/neck and MRI brain were unremarkable, no evidence of stroke.  He received IV fluids.  Echocardiogram obtained unremarkable.  He was started on aspirin.  Patient was advised to start statin but patient declined.  Patient had complete symptom resolution by time of discharge.  Discharge Diagnoses/Problem List:  Principal Problem:   TIA (transient ischemic attack) Active Problems:   Prediabetes   Hyperlipidemia     Disposition: Home  Discharge Condition: Stable  Discharge Exam:  Vitals:   07/10/22 0805 07/10/22 1220  BP: 127/82 126/79  Pulse: 71 72  Resp: 16 20  Temp: 98.2 F (36.8 C) 98.2 F (36.8 C)  SpO2: 99% 99%  General: Alert, NAD Eyes: PERRL, EOMI Cardiovascular: RRR, no murmurs Pulmonary: Clear to auscultation bilaterally Abdomen: Soft, nontender, positive bowel sounds Extremities: No edema Neuro: 5/5 strength upper and lower extremities   Issues for Follow Up:  1. Consider starting statin or other cholesterol-lowering medication  Significant Procedures: none  Significant Labs and Imaging:  Recent Labs  Lab 07/09/22 1450 07/09/22 1505 07/10/22 0637 07/10/22 1057  WBC 16.3*  --  6.7 6.4  HGB 14.8 15.0  13.5 13.9  HCT 44.3 44.0 40.1 42.3  PLT 338  --  257 285   Recent Labs  Lab 07/09/22 1450 07/09/22 1505 07/10/22 0637  NA 140 140 138  K 3.9 5.3* 4.2  CL 109 108 109  CO2 24  --  22  GLUCOSE 95 87 100*  BUN 14 20 14   CREATININE 1.34* 1.30* 1.12  CALCIUM 9.0  --  8.5*  ALKPHOS 67  --  61  AST 20  --  14*  ALT 17  --  13  ALBUMIN 4.0  --  3.4*    ECHOCARDIOGRAM COMPLETE IMPRESSIONS  1. Left ventricular ejection fraction, by estimation, is 60 to 65%. The left ventricle has normal function. The left ventricle has no regional wall motion abnormalities. There is mild left ventricular hypertrophy. Left ventricular diastolic parameters were normal.  2. Right ventricular systolic function is normal. The right ventricular size is normal.  3. The mitral valve is normal in structure. No evidence of mitral valve regurgitation.  4. The aortic valve was not well visualized. Aortic valve regurgitation is not visualized.  5. The inferior vena cava is normal in size with greater than 50% respiratory variability, suggesting right atrial pressure of 3 mmHg. IAS/Shunts: No atrial level shunt detected by color flow Doppler.    MR BRAIN WO CONTRAST IMPRESSION: Normal brain MRI. No acute intracranial abnormality identified.   CT ANGIO HEAD W OR WO CONTRAST IMPRESSION: 1. Negative CTA of the neck. 2. Normal CTA circle-of-Willis without significant proximal stenosis, aneurysm, or branch vessel occlusion. 3. Degenerative changes of the cervical spine are most pronounced at C5-6.  CT HEAD  CODE STROKE WO CONTRAST IMPRESSION: Negative CT of the head.  Results/Tests Pending at Time of Discharge: none  Discharge Medications:  Allergies as of 07/10/2022       Reactions   Diphenhydramine Other (See Comments)   Hallucinations.    Other    Blue or green surgical towels causes rash         Medication List     TAKE these medications    aspirin EC 81 MG tablet Take 1 tablet (81 mg total) by mouth  daily. Swallow whole. Start taking on: July 11, 2022        Discharge Instructions: Please refer to Patient Instructions section of EMR for full details.  Patient was counseled important signs and symptoms that should prompt return to medical care, changes in medications, dietary instructions, activity restrictions, and follow up appointments.   Follow-Up Appointments:  Follow-up Information     Eden Emms, NP Follow up.   Specialties: Nurse Practitioner, Family Medicine Why: As needed Contact information: [address] [place] [address] [phone]                 Bruce Deeds, MD 07/10/2022, 2:06 PM PGY-3, [facility] Family Medicine

## 2022-07-10 NOTE — Evaluation (Signed)
Physical Therapy Evaluation Patient Details Name: Bruce Diaz MRN: [id number] DOB: 04/02/1966 Today's Date: 07/10/2022  History of Present Illness  Pt is a 56 y.o. male admitted 7/29 with dx of TIA. He presented via EMS for questionable syncope, weakness, and dizziness.  In ED, found to have left-sided facial droop and extremity weakness. Symptoms resolved prior to neurology consult. MRI negative. PMH: GERD   Clinical Impression  PT eval complete. Pt is independent with all functional mobility. 24/24 DGI balance assessment. Strength is intact and symmetrical bilaterally.  No visual changes noted. Educated pt on BEFAST. No further skilled PT intervention indicated. PT signing off.       Recommendations for follow up therapy are one component of a multi-disciplinary discharge planning process, led by the attending physician.  Recommendations may be updated based on patient status, additional functional criteria and insurance authorization.  Follow Up Recommendations No PT follow up      Assistance Recommended at Discharge None  Patient can return home with the following       Equipment Recommendations None recommended by PT  Recommendations for Other Services       Functional Status Assessment Patient has not had a recent decline in their functional status     Precautions / Restrictions Precautions Precautions: None      Mobility  Bed Mobility Overal bed mobility: Independent                  Transfers Overall transfer level: Independent Equipment used: None                    Ambulation/Gait Ambulation/Gait assistance: Independent Gait Distance (Feet): 750 Feet Assistive device: None Gait Pattern/deviations: WFL(Within Functional Limits)   Gait velocity interpretation: >4.37 ft/sec, indicative of normal walking speed      Stairs Stairs: Yes Stairs assistance: Modified independent (Device/Increase time) Stair Management: One rail Left,  Forwards Number of Stairs: 12    Wheelchair Mobility    Modified Rankin (Stroke Patients Only) Modified Rankin (Stroke Patients Only) Pre-Morbid Rankin Score: No symptoms Modified Rankin: No symptoms     Balance Overall balance assessment: Independent                               Standardized Balance Assessment Standardized Balance Assessment : Dynamic Gait Index   Dynamic Gait Index Level Surface: Normal Change in Gait Speed: Normal Gait with Horizontal Head Turns: Normal Gait with Vertical Head Turns: Normal Gait and Pivot Turn: Normal Step Over Obstacle: Normal Step Around Obstacles: Normal Steps: Normal Total Score: 24       Pertinent Vitals/Pain Pain Assessment Pain Assessment: No/denies pain    Home Living Family/patient expects to be discharged to:: Private residence Living Arrangements: Spouse/significant other Available Help at Discharge: Family Type of Home: House Home Access: Level entry       Home Layout: One level Home Equipment: None      Prior Function Prior Level of Function : Independent/Modified Independent;Working/employed;Driving                     Hand Dominance        Extremity/Trunk Assessment   Upper Extremity Assessment Upper Extremity Assessment: Overall WFL for tasks assessed    Lower Extremity Assessment Lower Extremity Assessment: Overall WFL for tasks assessed    Cervical / Trunk Assessment Cervical / Trunk Assessment: Normal  Communication   Communication: No difficulties  Cognition Arousal/Alertness: Awake/alert Behavior During Therapy: WFL for tasks assessed/performed Overall Cognitive Status: Within Functional Limits for tasks assessed                                          General Comments General comments (skin integrity, edema, etc.): Educated on BEFAST.    Exercises     Assessment/Plan    PT Assessment Patient does not need any further PT services  PT  Problem List         PT Treatment Interventions      PT Goals (Current goals can be found in the Care Plan section)  Acute Rehab PT Goals Patient Stated Goal: home today PT Goal Formulation: All assessment and education complete, DC therapy    Frequency       Co-evaluation               AM-PAC PT "6 Clicks" Mobility  Outcome Measure Help needed turning from your back to your side while in a flat bed without using bedrails?: None Help needed moving from lying on your back to sitting on the side of a flat bed without using bedrails?: None Help needed moving to and from a bed to a chair (including a wheelchair)?: None Help needed standing up from a chair using your arms (e.g., wheelchair or bedside chair)?: None Help needed to walk in hospital room?: None Help needed climbing 3-5 steps with a railing? : None 6 Click Score: 24    End of Session   Activity Tolerance: Patient tolerated treatment well Patient left: in bed Nurse Communication: Mobility status PT Visit Diagnosis: Difficulty in walking, not elsewhere classified (R26.2)    Time: 0786-7544 PT Time Calculation (min) (ACUTE ONLY): 20 min   Charges:   PT Evaluation $PT Eval Low Complexity: 1 Low          Aida Raider, PT  Office # [phone] Pager [phone]   Ilda Foil 07/10/2022, 8:30 AM

## 2022-07-10 NOTE — Progress Notes (Signed)
  Echocardiogram 2D Echocardiogram has been performed.  Delcie Roch 07/10/2022, 9:48 AM

## 2022-07-10 NOTE — Discharge Instructions (Addendum)
Dear Maida Sale,   Thank you so much for allowing [place] to be part of your care!  You were admitted to [facility] for possible heat exhaustion and TIA (mini-stroke). Your brain MRI did not show any signs of stroke.    POST-HOSPITAL & CARE INSTRUCTIONS Start taking a baby aspirin every day for stroke prevention. Talk to your primary doctor about a statin medication or other cholesterol-lowering medication to help prevent strokes. Please let PCP/Specialists know of any changes that were made.  Please see medications section of this packet for any medication changes.   DOCTOR'S APPOINTMENT & FOLLOW UP CARE INSTRUCTIONS  Future Appointments  Date Time Provider Department Center  12/29/2022  7:30 AM [facility] LAB [facility]    RETURN PRECAUTIONS: Please return to the ED if you develop facial droop, garbled speech, or weakness on one side of your body.  Take care and be well!  Family Medicine Teaching Service  [place]  [facility]  [address] [place], [place] [address] [phone]

## 2022-07-10 NOTE — Progress Notes (Addendum)
STROKE TEAM PROGRESS NOTE   INTERVAL HISTORY Would like to return to work Wednesday  Recommend electrolytes when working outside in the heat- examples pedialyte or liquid IV in addition to water. Also discussed taking breaks and ensuring to eat throughout the day when working. MRI Negative for acute infarct.  Vitals:   07/09/22 2042 07/09/22 2325 07/10/22 0332 07/10/22 0805  BP: 117/73 110/68 105/74 127/82  Pulse: 79 72 68 71  Resp: 17 15 16 16   Temp: 97.6 F (36.4 C) 98 F (36.7 C)  98.2 F (36.8 C)  TempSrc:  Oral  Oral  SpO2: 97% 97% 98% 99%  Weight:      Height:       CBC:  Recent Labs  Lab 07/09/22 1450 07/09/22 1505 07/10/22 0637  WBC 16.3*  --  6.7  NEUTROABS 13.2*  --   --   HGB 14.8 15.0 13.5  HCT 44.3 44.0 40.1  MCV 88.2  --  88.3  PLT 338  --  99991111   Basic Metabolic Panel:  Recent Labs  Lab 07/09/22 1450 07/09/22 1505 07/10/22 0637  NA 140 140 138  K 3.9 5.3* 4.2  CL 109 108 109  CO2 24  --  22  GLUCOSE 95 87 100*  BUN 14 20 14   CREATININE 1.34* 1.30* 1.12  CALCIUM 9.0  --  8.5*   Lipid Panel:  Recent Labs  Lab 07/10/22 0637  CHOL 226*  TRIG 132  HDL 36*  CHOLHDL 6.3  VLDL 26  LDLCALC 164*   HgbA1c: No results for input(s): "HGBA1C" in the last 168 hours. Urine Drug Screen: No results for input(s): "LABOPIA", "COCAINSCRNUR", "LABBENZ", "AMPHETMU", "THCU", "LABBARB" in the last 168 hours.  Alcohol Level  Recent Labs  Lab 07/09/22 1450  ETH <10    IMAGING past 24 hours MR BRAIN WO CONTRAST  Result Date: 07/09/2022 CLINICAL DATA:  Initial evaluation for neuro deficit, stroke suspected. EXAM: MRI HEAD WITHOUT CONTRAST TECHNIQUE: Multiplanar, multiecho pulse sequences of the brain and surrounding structures were obtained without intravenous contrast. COMPARISON:  Prior CT from earlier the same day. FINDINGS: Brain: Cerebral volume within normal limits. No focal parenchymal signal abnormality or significant cerebral white matter disease. No  evidence for acute or subacute infarct. Gray-white matter differentiation maintained. No areas of chronic cortical infarction. No acute or chronic intracranial blood products. No mass lesion, midline shift or mass effect. No hydrocephalus or extra-axial fluid collection. Pituitary gland and suprasellar region within normal limits. Vascular: Major intracranial vascular flow voids are maintained. Skull and upper cervical spine: Craniocervical junction normal. Bone marrow signal intensity within normal limits. No scalp soft tissue abnormality. Sinuses/Orbits: Globes orbital soft tissues within normal limits. Paranasal sinuses are clear. No mastoid effusion. Other: None. IMPRESSION: Normal brain MRI. No acute intracranial abnormality identified. Electronically Signed   By: Jeannine Boga M.D.   On: 07/09/2022 19:34   DG Shoulder Left  Result Date: 07/09/2022 CLINICAL DATA:  Previous bilateral shoulder surgery. MRI clearance protocol. EXAM: LEFT SHOULDER - 2+ VIEW; RIGHT SHOULDER - 2+ VIEW COMPARISON:  None Available. FINDINGS: No metallic foreign body in either shoulder or the visualized thorax. Visualized lung fields are clear. Mild osteoarthritis of the right acromioclavicular joint. IMPRESSION: No metallic foreign body.  Patient is clear for MRI. Electronically Signed   By: Ileana Roup M.D.   On: 07/09/2022 17:30   DG Shoulder Right  Result Date: 07/09/2022 CLINICAL DATA:  Previous bilateral shoulder surgery. MRI clearance protocol. EXAM: LEFT SHOULDER -  2+ VIEW; RIGHT SHOULDER - 2+ VIEW COMPARISON:  None Available. FINDINGS: No metallic foreign body in either shoulder or the visualized thorax. Visualized lung fields are clear. Mild osteoarthritis of the right acromioclavicular joint. IMPRESSION: No metallic foreign body.  Patient is clear for MRI. Electronically Signed   By: Sherron Ales M.D.   On: 07/09/2022 17:30   CT ANGIO HEAD W OR WO CONTRAST  Result Date: 07/09/2022 CLINICAL DATA:  Acute  onset of left facial droop and numbness. EXAM: CT ANGIOGRAPHY HEAD AND NECK TECHNIQUE: Multidetector CT imaging of the head and neck was performed using the standard protocol during bolus administration of intravenous contrast. Multiplanar CT image reconstructions and MIPs were obtained to evaluate the vascular anatomy. Carotid stenosis measurements (when applicable) are obtained utilizing NASCET criteria, using the distal internal carotid diameter as the denominator. RADIATION DOSE REDUCTION: This exam was performed according to the departmental dose-optimization program which includes automated exposure control, adjustment of the mA and/or kV according to patient size and/or use of iterative reconstruction technique. CONTRAST:  84mL OMNIPAQUE IOHEXOL 350 MG/ML SOLN COMPARISON:  CT head without contrast 07/09/2022 FINDINGS: CTA NECK FINDINGS Aortic arch: A 3 vessel arch configuration present. No focal vascular disease is evident. Right carotid system: Right common carotid artery is within normal limits. Bifurcation is unremarkable. Cervical right ICA is normal. Left carotid system: The left common carotid artery is within normal limits. Bifurcation is unremarkable. Cervical left ICA is normal. Vertebral arteries: The right vertebral artery is the dominant vessel. Both vertebral arteries originate from the subclavian arteries without significant stenosis. No significant stenosis is present in either vertebral artery in the neck. Skeleton: Degenerative changes are most pronounced at C5-6. No focal osseous lesions are present. Other neck: Soft tissues the neck are otherwise unremarkable. Salivary glands are within normal limits. Thyroid is normal. No significant adenopathy is present. No focal mucosal or submucosal lesions are present. Upper chest: The lung apices are clear. Thoracic inlet is within normal limits. Review of the MIP images confirms the above findings CTA HEAD FINDINGS Anterior circulation: The internal  carotid arteries are within normal limits through the ICA termini. The A1 and M1 segments are normal. The anterior communicating artery is patent. MCA bifurcations are within normal limits. ACA and MCA branch vessels are normal. Posterior circulation: The right vertebral artery is dominant vessel. PICA origins are visualized normal. The vertebrobasilar junction basilar artery is normal. Both posterior cerebral arteries originate from basilar tip. PCA branch vessels within normal limits bilaterally. Venous sinuses: The dural sinuses are patent. The straight sinus deep cerebral veins are intact. Cortical veins are within normal limits. No significant vascular malformation is evident. Anatomic variants: None Review of the MIP images confirms the above findings IMPRESSION: 1. Negative CTA of the neck. 2. Normal CTA circle-of-Willis without significant proximal stenosis, aneurysm, or branch vessel occlusion. 3. Degenerative changes of the cervical spine are most pronounced at C5-6. Electronically Signed   By: Marin Roberts M.D.   On: 07/09/2022 15:37   CT HEAD CODE STROKE WO CONTRAST  Result Date: 07/09/2022 CLINICAL DATA:  Code stroke.  Left facial droop. Arm numbness. EXAM: CT HEAD WITHOUT CONTRAST TECHNIQUE: Contiguous axial images were obtained from the base of the skull through the vertex without intravenous contrast. RADIATION DOSE REDUCTION: This exam was performed according to the departmental dose-optimization program which includes automated exposure control, adjustment of the mA and/or kV according to patient size and/or use of iterative reconstruction technique. COMPARISON:  CT head without  contrast 09/05/2016 FINDINGS: Brain: No acute infarct, hemorrhage, or mass lesion is present. No significant white matter lesions are present. Basal ganglia are intact. Insular ribbon is normal. The ventricles are of normal size. No significant extraaxial fluid collection is present. Vascular: No hyperdense  vessel or unexpected calcification. Skull: Calvarium is intact. No focal lytic or blastic lesions are present. No significant extracranial soft tissue lesion is present. Sinuses/Orbits: The paranasal sinuses and mastoid air cells are clear. The globes and orbits are within normal limits. ASPECTS [facility] Stroke Program Early CT Score) - Ganglionic level infarction (caudate, lentiform nuclei, internal capsule, insula, M1-M3 cortex): 7/7 - Supraganglionic infarction (M4-M6 cortex): 3/3 Total score (0-10 with 10 being normal): 10/10 IMPRESSION: Negative CT of the head. Aspects 10/10 The above was relayed via text pager to Dr. Leonel Ramsay on 07/09/2022 at 15:10 . * Electronically Signed   By: San Morelle M.D.   On: 07/09/2022 15:11    PHYSICAL EXAM GENERAL: Awake, alert in NAD HEENT: - Normocephalic and atraumatic, dry mm LUNGS - Clear to auscultation bilaterally with no wheezes CV - S1S2 RRR, no m/r/g, equal pulses bilaterally.   NEURO:  Mental Status: AA&Ox4 Language: speech is clear  Naming, repetition, fluency, and comprehension intact. Cranial Nerves: PERRL, EOMI, visual fields full, no facial asymmetry, facial sensation intact, hearing intact, tongue/uvula/soft palate midline, normal sternocleidomastoid and trapezius muscle strength. No evidence of tongue atrophy or fibrillations Motor: 5/5 in all 4 extremities Tone: is normal and bulk is normal Sensation- Intact to light touch bilaterally Coordination: FTN intact bilaterally, no ataxia in BLE. Gait- deferred  ASSESSMENT/PLAN Mr. HUTCH CAPLINGER is a 56 y.o. male with history of GERD presenting with diaphoresis and numbness and tingling in the arms and legs. He had been working outside in the heat prior to coming to the ED and did report not drinking water. His coworkers found him confused with difficulty speaking and a Code Stroke was called for left facial droop that was noted in the ED. Symptoms have resolved. Education on electrolyte  supplementation and hydration when working in the heat for long hours  TIA vs dehydration Code Stroke CT head No acute abnormality.  CTA head & neck Normal CTA circle-of-Willis without significant proximal stenosis, aneurysm, or branch vessel occlusion. MRI  no acute abnormality 2D Echo EF 60-65% LDL 176 HgbA1c 5.7 VTE prophylaxis - lovenox    Diet   Diet regular Room service appropriate? Yes; Fluid consistency: Thin   No antithrombotic prior to admission, now on aspirin 81 mg daily for secondary prevention Therapy recommendations:  No follow up Disposition:  discharge home  Hyperlipidemia LDL 176, goal < 70 Refused statin, but did agree to follow up with PCP to discuss other options for lipid control Continue statin at discharge   Hospital day # 0  Patient seen and examined by NP/APP with MD. MD to update note as needed.   Janine Ores, DNP, FNP-BC [facility] Pager: [phone]  ATTENDING ATTESTATION:  56 year old with appears to be likely heat exhaustion from being out in the heat.  He works on [employer].  He was outdoors all day on day of presentation drink couple coffee but no water.  Symptoms had resolved.  MRI is negative.  Work-up is complete.  Discussed drinking plenty of water during the day to avoid complications.  The patient and his family's questions were answered to their satisfaction.   Dr. Reeves Forth evaluated pt independently, reviewed imaging, chart, labs. Discussed and formulated plan with the  APP. Please see APP note above for details.   Total 36 minutes spent on counseling patient and coordinating care, writing notes and reviewing chart.   Rosevelt Luu,MD    To contact Stroke Continuity provider, please refer to [email or web address]. After hours, contact General Neurology

## 2022-07-10 NOTE — Progress Notes (Signed)
Pt a/O x4, VS stable. AVS printed, given. Education provided. All questions are answered. Belongings with the pt. Transferred off the floor

## 2022-07-10 NOTE — Progress Notes (Signed)
SLP Cancellation Note  Patient Details Name: SUHAAS AGENA MRN: [id number] DOB: 1966/03/21   Cancelled treatment: SLP screened chart and had discussion with PT. MRI negative; symptoms resolived. No SLP eval needed.   Cinnamon Morency L. Samson Frederic, MA CCC/SLP Clinical Specialist - Acute Care SLP Acute Rehabilitation Services Office number [phone]           Blenda Mounts Laurice 07/10/2022, 9:02 AM

## 2022-07-10 NOTE — Progress Notes (Signed)
OT Cancellation Note  Patient Details Name: Bruce Diaz MRN: [id number] DOB: 1966-09-30   Cancelled Treatment:    Reason Eval/Treat Not Completed: OT screened, no needs identified, will sign off (Discussed with PT, pt back at baseline, has not OT needs at this time. OT will s/o, please reconsult if there is a change in pt status)  Alfonzo Beers, OTD, OTR/L Acute Rehab [phone]   Mayer Masker 07/10/2022, 8:46 AM

## 2022-12-15 ENCOUNTER — Other Ambulatory Visit: Payer: Self-pay | Admitting: Nurse Practitioner

## 2022-12-15 DIAGNOSIS — E785 Hyperlipidemia, unspecified: Secondary | ICD-10-CM

## 2022-12-15 DIAGNOSIS — R7303 Prediabetes: Secondary | ICD-10-CM

## 2022-12-29 ENCOUNTER — Other Ambulatory Visit: Payer: Self-pay

## 2023-04-13 DIAGNOSIS — R202 Paresthesia of skin: Secondary | ICD-10-CM | POA: Diagnosis not present

## 2023-04-13 DIAGNOSIS — R200 Anesthesia of skin: Secondary | ICD-10-CM | POA: Diagnosis not present

## 2023-04-13 DIAGNOSIS — G5601 Carpal tunnel syndrome, right upper limb: Secondary | ICD-10-CM | POA: Diagnosis not present

## 2023-05-25 DIAGNOSIS — H524 Presbyopia: Secondary | ICD-10-CM | POA: Diagnosis not present

## 2023-05-25 DIAGNOSIS — H52213 Irregular astigmatism, bilateral: Secondary | ICD-10-CM | POA: Diagnosis not present

## 2023-05-25 DIAGNOSIS — H5203 Hypermetropia, bilateral: Secondary | ICD-10-CM | POA: Diagnosis not present

## 2023-08-29 ENCOUNTER — Emergency Department: Payer: Worker's Compensation

## 2023-08-29 ENCOUNTER — Emergency Department
Admission: EM | Admit: 2023-08-29 | Discharge: 2023-08-29 | Disposition: A | Discharge status: Home / Self Care | Payer: Worker's Compensation | Attending: Emergency Medicine | Admitting: Emergency Medicine

## 2023-08-29 ENCOUNTER — Other Ambulatory Visit: Payer: Self-pay

## 2023-08-29 DIAGNOSIS — M19012 Primary osteoarthritis, left shoulder: Secondary | ICD-10-CM | POA: Diagnosis not present

## 2023-08-29 DIAGNOSIS — X500XXA Overexertion from strenuous movement or load, initial encounter: Secondary | ICD-10-CM | POA: Insufficient documentation

## 2023-08-29 DIAGNOSIS — M25512 Pain in left shoulder: Secondary | ICD-10-CM | POA: Diagnosis not present

## 2023-08-29 DIAGNOSIS — S46912A Strain of unspecified muscle, fascia and tendon at shoulder and upper arm level, left arm, initial encounter: Secondary | ICD-10-CM | POA: Diagnosis not present

## 2023-08-29 MED ORDER — KETOROLAC TROMETHAMINE 30 MG/ML IJ SOLN
30.0000 mg | Freq: Once | INTRAMUSCULAR | Status: AC
  Administered 2023-08-29: 30 mg via INTRAMUSCULAR
  Filled 2023-08-29: qty 1

## 2023-08-29 MED ORDER — ETODOLAC 400 MG PO TABS: 400.0000 mg | ORAL_TABLET | Freq: Two times a day (BID) | ORAL | 0 refills | Status: DC

## 2023-08-29 NOTE — ED Triage Notes (Signed)
Pt comes with c/o left shoulder pain and under left arm. 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 Pt states this happened yesterday. Pt states he felt something pull. Pt states he took tylenol with little relief. Pt states he also used his shock thing but it didn't really help either.

## 2023-08-29 NOTE — Discharge Instructions (Addendum)
Follow up with Dr. Audelia Acton if any continued problems.  Call and make an appointment.  His contact information is listed on your discharge papers.  Ice to your muscles as needed for discomfort.  Also a prescription for etodolac was sent to the pharmacy for you to begin taking twice a day with food.

## 2023-08-29 NOTE — ED Notes (Signed)
Pt now states this needs to be WC filed.

## 2023-08-29 NOTE — ED Notes (Signed)
See triage note  Presents with pain to left shoulder  States he developed pain after lifting some panels  Increased pain with movement  Good pulses

## 2023-08-29 NOTE — ED Provider Notes (Signed)
[facility] Provider Note    Event Date/Time   First MD Initiated Contact with Patient 08/29/23 1137     (approximate)   History   Shoulder Injury   HPI  Bruce Diaz is a 57 y.o. male   presents to the ED with complaint of left shoulder pain.  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  He used a muscle stimulator and took Tylenol last evening without any relief.  Patient has had bilateral rotator cuff repairs in the past.  Patient has a history of TIA, prediabetes, low back pain without sciatica.    Physical Exam   Triage Vital Signs: ED Triage Vitals  Encounter Vitals Group     BP 08/29/23 1032 (!) 149/91     Systolic BP Percentile --      Diastolic BP Percentile --      Pulse Rate 08/29/23 1032 68     Resp 08/29/23 1032 18     Temp 08/29/23 1032 97.8 F (36.6 C)     Temp Source 08/29/23 1032 Oral     SpO2 08/29/23 1032 100 %     Weight 08/29/23 1034 210 lb (95.3 kg)     Height 08/29/23 1034 6' (1.829 m)     Head Circumference --      Peak Flow --      Pain Score 08/29/23 1040 10     Pain Loc --      Pain Education --      Exclude from Growth Chart --     Most recent vital signs: Vitals:   08/29/23 1032  BP: (!) 149/91  Pulse: 68  Resp: 18  Temp: 97.8 F (36.6 C)  SpO2: 100%     General: Awake, no distress.  CV:  Good peripheral perfusion.  Heart regular rate and rhythm. Resp:  Normal effort.  Lungs clear bilaterally. Abd:  No distention.  Other:  On examination of left shoulder there is no gross deformity and no crepitus with range of motion.  Patient is able to fully abduct without difficulty.  Moderate tenderness on palpation of the medial parascapular muscles.   ED Results / Procedures / Treatments   Labs (all labs ordered are listed, but only abnormal results are displayed) Labs Reviewed - No data to display    RADIOLOGY  X-ray images of the left shoulder  was reviewed and interpreted by myself independent of the radiologist and was negative for acute bony abnormality.   PROCEDURES:  Critical Care performed:   Procedures   MEDICATIONS ORDERED IN ED: Medications  ketorolac (TORADOL) 30 MG/ML injection 30 mg (30 mg Intramuscular Given 08/29/23 1211)     IMPRESSION / MDM / ASSESSMENT AND PLAN / ED COURSE  I reviewed the triage vital signs and the nursing notes.   Differential diagnosis includes, but is not limited to, shoulder strain, sprain, dislocation, fracture, degenerative joint disease.  57 year old male presents to the ED with complaint of left shoulder pain that occurred yesterday while at work lifting refrigerator panels.  Patient was given Toradol 30 mg IM while in the ED. x-rays were reassuring and patient was made aware.  A prescription for etodolac 400 mg twice daily was sent to the pharmacy.  Patient is instructed to use ice to his muscles as needed for discomfort and to follow-up with Dr. Audelia Acton if not improving in 1 week.  A work restriction note was given to him  to take to his supervisor.      Patient's presentation is most consistent with acute complicated illness / injury requiring diagnostic workup.  FINAL CLINICAL IMPRESSION(S) / ED DIAGNOSES   Final diagnoses:  Muscle strain of left shoulder region, initial encounter     Rx / DC Orders   ED Discharge Orders          Ordered    etodolac (LODINE) 400 MG tablet  2 times daily        08/29/23 1249             Note:  This document was prepared using Dragon voice recognition software and may include unintentional dictation errors.   Tommi Rumps, PA-C 08/29/23 1256    Sharman Cheek, MD 08/29/23 [phone]

## 2024-02-02 ENCOUNTER — Ambulatory Visit: Payer: BC Managed Care – PPO | Admitting: Nurse Practitioner

## 2024-02-02 VITALS — BP 118/70 | HR 65 | Temp 97.9°F | Ht 72.0 in | Wt 219.0 lb

## 2024-02-02 DIAGNOSIS — R7303 Prediabetes: Secondary | ICD-10-CM | POA: Diagnosis not present

## 2024-02-02 DIAGNOSIS — H538 Other visual disturbances: Secondary | ICD-10-CM

## 2024-02-02 DIAGNOSIS — G44209 Tension-type headache, unspecified, not intractable: Secondary | ICD-10-CM | POA: Diagnosis not present

## 2024-02-02 MED ORDER — CYCLOBENZAPRINE HCL 5 MG PO TABS: 5.0000 mg | ORAL_TABLET | Freq: Two times a day (BID) | ORAL | 0 refills | Status: AC | PRN

## 2024-02-02 NOTE — Progress Notes (Signed)
 Acute Office Visit  Subjective:     Patient ID: Bruce Diaz, male    DOB: 05/04/1966, 58 y.o.   MRN: [id number]  Chief Complaint  Patient presents with  . Headache    Pt complains of headache mostly back of the head. Pt states pain will eventually cause blurry vision. Pt complains that Vision is more blurred with eyeglasses than without. Says he got new glasses 6 months ago. Dull achy pain. Sometimes has sensitivity to light. 1 month ago pt used sister glucose kit and sugar levels tested 49.     Chaden has a hx of HLD, heat stroke, and a concussion in 2018. He is here today with a posterior HA primarily on the R described as dull that started 2-3 months ago with associated blurred vision without photophobia, nausea, or focal neuro symptoms. HA lasts anywhere from 2 hours to a couple of days. HA come on at random at various times of day. The blurred vision starts after the HA and is worse when he has his glasses on as opposed to being without his glasses. The blurred vision resolves when the HA resolves. Pt takes has taken Nervive topical which helps with the pain. He has tried APAP and ibuprofen without relief. Pt reports he got new glasses in September before the HA started.   Review of Systems  Constitutional:  Negative for chills, fever and weight loss.  HENT:  Negative for hearing loss.   Eyes:  Positive for blurred vision. Negative for photophobia.  Respiratory:  Negative for shortness of breath.   Cardiovascular:  Negative for chest pain and palpitations.  Gastrointestinal:  Negative for abdominal pain, nausea and vomiting.  Musculoskeletal:  Positive for neck pain. Negative for myalgias.  Skin:  Negative for rash.  Neurological:  Positive for headaches. Negative for dizziness, tingling, sensory change, speech change, focal weakness and weakness.        Objective:    BP 118/70   Pulse 65   Temp 97.9 F (36.6 C) (Oral)   Ht 6' (1.829 m)   Wt 99.3 kg   SpO2 98%   BMI  29.70 kg/m    Physical Exam Constitutional:      Appearance: Normal appearance. He is not ill-appearing.  Eyes:     General:        Left eye: Hordeolum present.    Extraocular Movements: Extraocular movements intact.     Pupils: Pupils are equal, round, and reactive to light.  Neck:     Comments: Tenderness and tightness on palpation of the R trapezius. No nuchal rigidity present.  Cardiovascular:     Rate and Rhythm: Normal rate and regular rhythm.     Pulses: Normal pulses.     Heart sounds: Normal heart sounds. No murmur heard.    No friction rub. No gallop.  Pulmonary:     Effort: Pulmonary effort is normal.     Breath sounds: Normal breath sounds.  Musculoskeletal:     Cervical back: Neck supple. No rigidity. Muscular tenderness present.  Neurological:     Mental Status: He is alert.     Cranial Nerves: Cranial nerves 2-12 are intact.     Sensory: Sensation is intact.     Motor: Motor function is intact.     Coordination: Coordination is intact.     Deep Tendon Reflexes:     Reflex Scores:      Bicep reflexes are 2+ on the right side and 2+ on the left  side.      Patellar reflexes are 2+ on the right side and 2+ on the left side.    Comments: Bilateral upper and lower extremity strength 5/5    No results found for any visits on 02/02/24.      Assessment & Plan:   Problem List Items Addressed This Visit     Prediabetes   Labs pending      Relevant Orders   CBC   Hemoglobin A1c   BASIC METABOLIC PANEL WITH GFR   Tension headache - Primary   HA likely stems from tension in the R trapezius muscle. Other differential considered but not limited to migraine, glucose dysregulation, CVA/TIA. Pt educated in stress and tension reduction techniques. Continue Nervive or other OTC topical lidocaine preparations to be applied locally.  Flexeril 5 mg BID PRN for muscle spasms. Sedation precautions reviewed regarding Flexeril. Labs pending.       Relevant Medications    cyclobenzaprine (FLEXERIL) 5 MG tablet   Blurred vision, bilateral   Labs pending      Relevant Orders   CBC   Hemoglobin A1c   BASIC METABOLIC PANEL WITH GFR    Meds ordered this encounter  Medications  . cyclobenzaprine (FLEXERIL) 5 MG tablet    Sig: Take 1 tablet (5 mg total) by mouth 2 (two) times daily as needed for muscle spasms.    Dispense:  20 tablet    Refill:  0    Return in about 3 months (around 05/01/2024) for CPE.  Murvin Donning, RN

## 2024-02-02 NOTE — Progress Notes (Signed)
 Acute Office Visit  Subjective:     Patient ID: Bruce Diaz, male    DOB: 25-Apr-1966, 58 y.o.   MRN: [id number]  Chief Complaint  Patient presents with   Headache    Pt complains of headache mostly back of the head. Pt states pain will eventually cause blurry vision. Pt complains that Vision is more blurred with eyeglasses than without. Says he got new glasses 6 months ago. Dull achy pain. Sometimes has sensitivity to light. 1 month ago pt used sister glucose kit and sugar levels tested 49.     HPI Patient is in today for headache with a history of TIA, facial droop, and prediabetes.  Patient has a chief complaint of headaches.  This started approximately 2-3 months ago. No injury. States that he does experience blurred vision. States that blurred vision is more noticeable with glasses. States a dull pain in the posterior right that can last 2 hours to several days. States that the blurred vision resolves with resoultion of headache. He has tried tylenol and ibuprofen that does not help. He will try topical nervive with relief. He did get new glasses approx 6 months ago   Review of Systems  Constitutional:  Negative for chills and fever.  Respiratory:  Negative for shortness of breath.   Cardiovascular:  Negative for chest pain.  Musculoskeletal:  Positive for neck pain. Negative for joint pain.  Neurological:  Positive for headaches. Negative for tingling.  Psychiatric/Behavioral:  Negative for hallucinations and suicidal ideas.         Objective:    BP 118/70   Pulse 65   Temp 97.9 F (36.6 C) (Oral)   Ht 6' (1.829 m)   Wt 219 lb (99.3 kg)   SpO2 98%   BMI 29.70 kg/m    Physical Exam Vitals and nursing note reviewed.  Constitutional:      Appearance: Normal appearance.  HENT:     Mouth/Throat:     Mouth: Mucous membranes are moist.     Pharynx: Oropharynx is clear.  Eyes:     Extraocular Movements: Extraocular movements intact.     Pupils: Pupils are equal,  round, and reactive to light.  Cardiovascular:     Rate and Rhythm: Normal rate and regular rhythm.     Heart sounds: Normal heart sounds.  Pulmonary:     Effort: Pulmonary effort is normal.     Breath sounds: Normal breath sounds.  Musculoskeletal:        General: Tenderness present.       Arms:     Comments: Neck  rotation and lateral movements increase the pain   Neurological:     General: No focal deficit present.     Mental Status: He is alert.     Cranial Nerves: Cranial nerves 2-12 are intact.     Sensory: Sensation is intact.     Motor: Motor function is intact.     Coordination: Coordination is intact.     Gait: Gait is intact.     Deep Tendon Reflexes:     Reflex Scores:      Bicep reflexes are 2+ on the right side and 2+ on the left side.      Patellar reflexes are 2+ on the right side and 2+ on the left side.    Comments: Bilateral upper and lower extremity strength 5/5     No results found for any visits on 02/02/24.      Assessment & Plan:  Problem List Items Addressed This Visit       Other   Prediabetes   Labs pending   I evaluated patient, was consulted regarding treatment, and agree with assessment and plan per Denice Bors RN, FNP Student   Audria Nine, DNP, AGNP-C       Relevant Orders   CBC   Hemoglobin A1c   BASIC METABOLIC PANEL WITH GFR   Tension headache - Primary   HA likely stems from tension in the R trapezius muscle. Other differential considered but not limited to migraine, glucose dysregulation, CVA/TIA. Pt educated in stress and tension reduction techniques. Continue Nervive or other OTC topical lidocaine preparations to be applied locally.  Flexeril 5 mg BID PRN for muscle spasms. Sedation precautions reviewed regarding Flexeril. Labs pending.   I evaluated patient, was consulted regarding treatment, and agree with assessment and plan per Denice Bors RN, FNP Student   Audria Nine, DNP, AGNP-C       Relevant Medications    cyclobenzaprine (FLEXERIL) 5 MG tablet   Blurred vision, bilateral   Labs pending. If no resolve with medication, consider eye doctor visit or neurological imaging   I evaluated patient, was consulted regarding treatment, and agree with assessment and plan per Denice Bors RN, FNP Student   Audria Nine, DNP, AGNP-C        Relevant Orders   CBC   Hemoglobin A1c   BASIC METABOLIC PANEL WITH GFR    Meds ordered this encounter  Medications   cyclobenzaprine (FLEXERIL) 5 MG tablet    Sig: Take 1 tablet (5 mg total) by mouth 2 (two) times daily as needed for muscle spasms.    Dispense:  20 tablet    Refill:  0    Return in about 3 months (around 05/01/2024) for CPE.  Audria Nine, NP

## 2024-02-02 NOTE — Patient Instructions (Addendum)
 It was a pleasure seeing you today.  We have sent a prescription of Flexeril to your pharmacy.  This medication makes you sleepy you can not drive or operate heavy machinery after taking it.   Please make appointment in 3 months to perform your annual physical.

## 2024-02-02 NOTE — Assessment & Plan Note (Addendum)
 Labs pending. If no resolve with medication, consider eye doctor visit or neurological imaging   I evaluated patient, was consulted regarding treatment, and agree with assessment and plan per Denice Bors RN, FNP Student   Audria Nine, DNP, AGNP-C

## 2024-02-02 NOTE — Assessment & Plan Note (Addendum)
 Labs pending.   I evaluated patient, was consulted regarding treatment, and agree with assessment and plan per Denice Bors RN, FNP Student   Audria Nine, DNP, AGNP-C

## 2024-02-02 NOTE — Assessment & Plan Note (Addendum)
 HA likely stems from tension in the R trapezius muscle. Other differential considered but not limited to migraine, glucose dysregulation, CVA/TIA. Pt educated in stress and tension reduction techniques. Continue Nervive or other OTC topical lidocaine preparations to be applied locally.  Flexeril 5 mg BID PRN for muscle spasms. Sedation precautions reviewed regarding Flexeril. Labs pending.   I evaluated patient, was consulted regarding treatment, and agree with assessment and plan per Denice Bors RN, FNP Student   Audria Nine, DNP, AGNP-C

## 2024-02-03 LAB — CBC
HCT: 45.9 % (ref 38.5–50.0)
Hemoglobin: 15.2 g/dL (ref 13.2–17.1)
MCH: 29.3 pg (ref 27.0–33.0)
MCHC: 33.1 g/dL (ref 32.0–36.0)
MCV: 88.4 fL (ref 80.0–100.0)
MPV: 11 fL (ref 7.5–12.5)
Platelets: 326 10*3/uL (ref 140–400)
RBC: 5.19 10*6/uL (ref 4.20–5.80)
RDW: 12.9 % (ref 11.0–15.0)
WBC: 7.1 10*3/uL (ref 3.8–10.8)

## 2024-02-03 LAB — BASIC METABOLIC PANEL WITH GFR
BUN: 17 mg/dL (ref 7–25)
CO2: 28 mmol/L (ref 20–32)
Calcium: 9.6 mg/dL (ref 8.6–10.3)
Chloride: 104 mmol/L (ref 98–110)
Creat: 1.24 mg/dL (ref 0.70–1.30)
Glucose, Bld: 66 mg/dL (ref 65–99)
Potassium: 4.5 mmol/L (ref 3.5–5.3)
Sodium: 140 mmol/L (ref 135–146)
eGFR: 67 mL/min/{1.73_m2} (ref >=60)

## 2024-02-03 LAB — HEMOGLOBIN A1C
Hgb A1c MFr Bld: 5.6 %{Hb} (ref <5.7)
Mean Plasma Glucose: 114 mg/dL
eAG (mmol/L): 6.3 mmol/L

## 2024-02-04 ENCOUNTER — Encounter: Payer: Self-pay | Admitting: Nurse Practitioner

## 2024-02-07 ENCOUNTER — Telehealth: Payer: Self-pay | Admitting: Nurse Practitioner

## 2024-02-07 NOTE — Telephone Encounter (Signed)
 In the setting of a headache and blurred vision we need to get a CT scan of the head. Does he want Acequia or 

## 2024-02-07 NOTE — Telephone Encounter (Signed)
 Can we see if the muscle relaxer helped with the headache and blurred vision

## 2024-02-07 NOTE — Telephone Encounter (Signed)
-----   Message from [facility] sent at 02/02/2024  2:54 PM EST ----- Regarding: headache See if flexeril has helped with headahce and blurred vision

## 2024-02-07 NOTE — Telephone Encounter (Signed)
 Contacted pt.   Pt states that he has had some relief with his headaches. Complains that they are not as bad and thinks medication eased the pain he felt in the back of head.   Pt complains that the blurred vision has not improved. Went over labs. Pt verbalized understanding. Will call if any more concerns or questions.

## 2024-02-08 NOTE — Telephone Encounter (Signed)
 Left detailed VM for pt to call office back.

## 2024-02-12 NOTE — Telephone Encounter (Signed)
 Contacted pt in regards to CT scan.   Pt complains that he has an appointment with eye doctor on Thursday and would like to get their opinion first before having the CT scan done.

## 2024-02-15 DIAGNOSIS — H25042 Posterior subcapsular polar age-related cataract, left eye: Secondary | ICD-10-CM | POA: Diagnosis not present

## 2024-02-15 DIAGNOSIS — H25032 Anterior subcapsular polar age-related cataract, left eye: Secondary | ICD-10-CM | POA: Diagnosis not present

## 2024-04-08 DIAGNOSIS — H0279 Other degenerative disorders of eyelid and periocular area: Secondary | ICD-10-CM | POA: Diagnosis not present

## 2024-04-08 DIAGNOSIS — D485 Neoplasm of uncertain behavior of skin: Secondary | ICD-10-CM | POA: Diagnosis not present

## 2024-05-28 DIAGNOSIS — H25032 Anterior subcapsular polar age-related cataract, left eye: Secondary | ICD-10-CM | POA: Diagnosis not present

## 2024-05-28 DIAGNOSIS — H25042 Posterior subcapsular polar age-related cataract, left eye: Secondary | ICD-10-CM | POA: Diagnosis not present

## 2024-06-26 DIAGNOSIS — H2512 Age-related nuclear cataract, left eye: Secondary | ICD-10-CM | POA: Diagnosis not present

## 2024-06-26 DIAGNOSIS — H2513 Age-related nuclear cataract, bilateral: Secondary | ICD-10-CM | POA: Diagnosis not present

## 2024-06-26 DIAGNOSIS — H18413 Arcus senilis, bilateral: Secondary | ICD-10-CM | POA: Diagnosis not present

## 2024-06-26 DIAGNOSIS — H43393 Other vitreous opacities, bilateral: Secondary | ICD-10-CM | POA: Diagnosis not present

## 2024-06-26 DIAGNOSIS — H25013 Cortical age-related cataract, bilateral: Secondary | ICD-10-CM | POA: Diagnosis not present

## 2024-07-23 DIAGNOSIS — H268 Other specified cataract: Secondary | ICD-10-CM | POA: Diagnosis not present

## 2024-07-23 DIAGNOSIS — H2512 Age-related nuclear cataract, left eye: Secondary | ICD-10-CM | POA: Diagnosis not present

## 2024-07-23 DIAGNOSIS — H2511 Age-related nuclear cataract, right eye: Secondary | ICD-10-CM | POA: Diagnosis not present

## 2024-07-23 DIAGNOSIS — K219 Gastro-esophageal reflux disease without esophagitis: Secondary | ICD-10-CM | POA: Diagnosis not present

## 2024-07-30 DIAGNOSIS — Z961 Presence of intraocular lens: Secondary | ICD-10-CM | POA: Diagnosis not present

## 2024-07-30 DIAGNOSIS — H268 Other specified cataract: Secondary | ICD-10-CM | POA: Diagnosis not present

## 2024-07-30 DIAGNOSIS — H2511 Age-related nuclear cataract, right eye: Secondary | ICD-10-CM | POA: Diagnosis not present

## 2024-07-30 DIAGNOSIS — H25041 Posterior subcapsular polar age-related cataract, right eye: Secondary | ICD-10-CM | POA: Diagnosis not present
